# Patient Record
Sex: Male | Born: 1965 | Race: Black or African American | Hispanic: No | Marital: Single | Smoking: Current every day smoker
Health system: Southern US, Community
[De-identification: ages and names within clinical notes are randomized; demographics above are authoritative.]

## PROBLEM LIST (undated history)

## (undated) DIAGNOSIS — F329 Major depressive disorder, single episode, unspecified: Secondary | ICD-10-CM

## (undated) DIAGNOSIS — I10 Essential (primary) hypertension: Secondary | ICD-10-CM

## (undated) DIAGNOSIS — K469 Unspecified abdominal hernia without obstruction or gangrene: Secondary | ICD-10-CM

## (undated) DIAGNOSIS — F419 Anxiety disorder, unspecified: Secondary | ICD-10-CM

## (undated) DIAGNOSIS — F32A Depression, unspecified: Secondary | ICD-10-CM

## (undated) DIAGNOSIS — F319 Bipolar disorder, unspecified: Secondary | ICD-10-CM

## (undated) HISTORY — PX: OTHER SURGICAL HISTORY: SHX169

## (undated) HISTORY — PX: HEMORRHOID SURGERY: SHX153

## (undated) HISTORY — PX: HERNIA REPAIR: SHX51

---

## 2014-06-24 ENCOUNTER — Emergency Department (HOSPITAL_COMMUNITY)
Admission: EM | Admit: 2014-06-24 | Discharge: 2014-06-24 | Disposition: A | Discharge status: Home / Self Care | Payer: Medicaid Other | Attending: Emergency Medicine | Admitting: Emergency Medicine

## 2014-06-24 ENCOUNTER — Encounter (HOSPITAL_COMMUNITY): Payer: Self-pay | Admitting: Emergency Medicine

## 2014-06-24 DIAGNOSIS — H209 Unspecified iridocyclitis: Secondary | ICD-10-CM

## 2014-06-24 DIAGNOSIS — Y9289 Other specified places as the place of occurrence of the external cause: Secondary | ICD-10-CM | POA: Insufficient documentation

## 2014-06-24 DIAGNOSIS — Y9389 Activity, other specified: Secondary | ICD-10-CM | POA: Diagnosis not present

## 2014-06-24 DIAGNOSIS — Z720 Tobacco use: Secondary | ICD-10-CM | POA: Insufficient documentation

## 2014-06-24 DIAGNOSIS — W228XXA Striking against or struck by other objects, initial encounter: Secondary | ICD-10-CM | POA: Diagnosis not present

## 2014-06-24 DIAGNOSIS — S0592XA Unspecified injury of left eye and orbit, initial encounter: Secondary | ICD-10-CM | POA: Diagnosis present

## 2014-06-24 DIAGNOSIS — H11422 Conjunctival edema, left eye: Secondary | ICD-10-CM | POA: Insufficient documentation

## 2014-06-24 DIAGNOSIS — H2000 Unspecified acute and subacute iridocyclitis: Secondary | ICD-10-CM | POA: Diagnosis not present

## 2014-06-24 MED ORDER — PREDNISOLONE ACETATE 1 % OP SUSP
1.0000 [drp] | Freq: Once | OPHTHALMIC | Status: AC
  Administered 2014-06-24: 1 [drp] via OPHTHALMIC
  Filled 2014-06-24: qty 1

## 2014-06-24 MED ORDER — FLUORESCEIN SODIUM 1 MG OP STRP
1.0000 | ORAL_STRIP | Freq: Once | OPHTHALMIC | Status: AC
  Administered 2014-06-24: 1 via OPHTHALMIC
  Filled 2014-06-24: qty 1

## 2014-06-24 MED ORDER — TETRACAINE HCL 0.5 % OP SOLN
1.0000 [drp] | Freq: Once | OPHTHALMIC | Status: AC
  Administered 2014-06-24: 1 [drp] via OPHTHALMIC
  Filled 2014-06-24: qty 2

## 2014-06-24 MED ORDER — OXYCODONE-ACETAMINOPHEN 5-325 MG PO TABS: 1.0000 | ORAL_TABLET | Freq: Four times a day (QID) | ORAL | Status: DC | PRN

## 2014-06-24 MED ORDER — OXYCODONE-ACETAMINOPHEN 5-325 MG PO TABS
1.0000 | ORAL_TABLET | Freq: Once | ORAL | Status: AC
  Administered 2014-06-24: 1 via ORAL
  Filled 2014-06-24: qty 1

## 2014-06-24 MED ORDER — CYCLOPENTOLATE HCL 1 % OP SOLN
1.0000 [drp] | Freq: Once | OPHTHALMIC | Status: AC
  Administered 2014-06-24: 1 [drp] via OPHTHALMIC
  Filled 2014-06-24: qty 2

## 2014-06-24 NOTE — Discharge Instructions (Signed)
Please read and follow all provided instructions.  Your diagnoses today include:  1. Iritis, traumatic    Tests performed today include:  Visual acuity testing to check your vision  Fluorescein dye examination to look for scratches on your eye  Vital signs. See below for your results today.   Medications prescribed:   Pred forte - use one drop in left eye 4 times a day   Cyclogyl - use one drop in left eye 2 times a day   Take any prescribed medications only as directed.  Home care instructions:  Follow any educational materials contained in this packet.  If you wear contact lenses, do not use them until your eye caregiver approves. Follow-up care is necessary to be sure the corneal abrasion is healing if not completely resolved in 2-3 days. See your caregiver or eye specialist as suggested for followup.   Follow-up instructions: Please follow-up with the opthalmologist listed in the next 3 days for further evaluation if your symptoms do not improve.   Return instructions:   Please return to the Emergency Department if you experience worsening symptoms.   Please return immediately if you develop severe pain, pus drainage, new change in vision, or fever.  Please return if you have any other emergent concerns.  Additional Information:  Your vital signs today were: BP 123/69   Pulse 112   Temp(Src) 97.7 F (36.5 C) (Oral)   Resp 20   Ht 5\' 9"  (1.753 m)   Wt 185 lb (83.915 kg)   BMI 27.31 kg/m2   SpO2 97% If your blood pressure (BP) was elevated above 135/85 this visit, please have this repeated by your doctor within one month.
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
## 2014-06-24 NOTE — ED Notes (Signed)
Declined W/C at D/C and was escorted to lobby by RN. 
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
## 2014-06-24 NOTE — ED Provider Notes (Signed)
Medical screening examination/treatment/procedure(s) were performed by non-physician practitioner and as supervising physician I was immediately available for consultation/collaboration.   EKG Interpretation None        Martita Brumm, MD 06/24/14 1623 

## 2014-08-16 ENCOUNTER — Emergency Department (HOSPITAL_COMMUNITY)
Admission: EM | Admit: 2014-08-16 | Discharge: 2014-08-16 | Disposition: A | Discharge status: Home / Self Care | Payer: Self-pay | Attending: Emergency Medicine | Admitting: Emergency Medicine

## 2014-08-16 ENCOUNTER — Encounter (HOSPITAL_COMMUNITY): Payer: Self-pay | Admitting: Emergency Medicine

## 2014-08-16 DIAGNOSIS — K088 Other specified disorders of teeth and supporting structures: Secondary | ICD-10-CM | POA: Insufficient documentation

## 2014-08-16 DIAGNOSIS — Z720 Tobacco use: Secondary | ICD-10-CM | POA: Insufficient documentation

## 2014-08-16 DIAGNOSIS — K0889 Other specified disorders of teeth and supporting structures: Secondary | ICD-10-CM

## 2014-08-16 MED ORDER — PENICILLIN V POTASSIUM 500 MG PO TABS: 500.0000 mg | ORAL_TABLET | Freq: Three times a day (TID) | ORAL | Status: DC

## 2014-08-16 MED ORDER — TRAMADOL HCL 50 MG PO TABS: 50.0000 mg | ORAL_TABLET | Freq: Four times a day (QID) | ORAL | Status: DC | PRN

## 2014-08-16 NOTE — ED Notes (Signed)
Pt refused vitals, signed discharge and was escorted out by [employer]. Pt was given d/c paper worl, scripts and dental referral.
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
## 2014-08-16 NOTE — ED Notes (Signed)
Pt c/o [place] bone being stuck in upper right tooth line for 3 weeks. Pt states he has been tx for same complaint and put on abx but hasnt seen a dentist yet.

## 2014-08-16 NOTE — ED Notes (Signed)
Pt a/o x 4 on d/c, pt upset, see nursing notes.

## 2014-08-16 NOTE — ED Notes (Signed)
Pt ambulated to bathroom independently and with a steady gait, asking to see doctor.

## 2014-08-16 NOTE — Discharge Instructions (Signed)
Dental Pain °A tooth ache may be caused by cavities (tooth decay). Cavities expose the nerve of the tooth to air and hot or cold temperatures. It may come from an infection or abscess (also called a boil or furuncle) around your tooth. It is also often caused by dental caries (tooth decay). This causes the pain you are having. °DIAGNOSIS  °Your caregiver can diagnose this problem by exam. °TREATMENT  °· If caused by an infection, it may be treated with medications which kill germs (antibiotics) and pain medications as prescribed by your caregiver. Take medications as directed. °· Only take over-the-counter or prescription medicines for pain, discomfort, or fever as directed by your caregiver. °· Whether the tooth ache today is caused by infection or dental disease, you should see your dentist as soon as possible for further care. °SEEK MEDICAL CARE IF: °The exam and treatment you received today has been provided on an emergency basis only. This is not a substitute for complete medical or dental care. If your problem worsens or new problems (symptoms) appear, and you are unable to meet with your dentist, call or return to this location. °SEEK IMMEDIATE MEDICAL CARE IF:  °· You have a fever. °· You develop redness and swelling of your face, jaw, or neck. °· You are unable to open your mouth. °· You have severe pain uncontrolled by pain medicine. °MAKE SURE YOU:  °· Understand these instructions. °· Will watch your condition. °· Will get help right away if you are not doing well or get worse. °Document Released: 08/17/2005 Document Revised: 11/09/2011 Document Reviewed: 04/04/2008 °ExitCare® Patient Information ©2015 [employer]. This information is not intended to replace advice given to you by your health care provider. Make sure you discuss any questions you have with your health care provider. ° °Emergency Department Resource Guide °1) Find a Doctor and Pay Out of Pocket °Although you won't have to find out who  is covered by your insurance plan, it is a good idea to ask around and get recommendations. You will then need to call the office and see if the doctor you have chosen will accept you as a new patient and what types of options they offer for patients who are self-pay. Some doctors offer discounts or will set up payment plans for their patients who do not have insurance, but you will need to ask so you aren't surprised when you get to your appointment. ° °2) Contact Your Local Health Department °Not all health departments have doctors that can see patients for sick visits, but many do, so it is worth a call to see if yours does. If you don't know where your local health department is, you can check in your phone book. The CDC also has a tool to help you locate your state's health department, and many state websites also have listings of all of their local health departments. ° °3) Find a Walk-in Clinic °If your illness is not likely to be very severe or complicated, you may want to try a walk in clinic. These are popping up all over the country in pharmacies, drugstores, and shopping centers. They're usually staffed by nurse practitioners or physician assistants that have been trained to treat common illnesses and complaints. They're usually fairly quick and inexpensive. However, if you have serious medical issues or chronic medical problems, these are probably not your best option. ° °No Primary Care Doctor: °- Call Health Connect at  832-8000 - they can help you locate a primary   care doctor that  accepts your insurance, provides certain services, etc. °- Physician Referral Service- 1-800-533-3463 ° °Chronic Pain Problems: °Organization         Address  Phone   Notes  °[facility]  ([phone]) [phone] Patients need to be referred by their primary care doctor.  ° °Medication Assistance: °Organization         Address  Phone   Notes  °[place] [facility] [address]  [address] °[place], [place] [address] (336) 641-8030 --Must be a resident of [place] °-- Must have NO insurance coverage whatsoever (no Medicaid/ Medicare, etc.) °-- The pt. MUST have a primary care doctor that directs their care regularly and follows them in the community °  °[facility]  (866) 331-1348   °[employer]  (888) 892-1162   ° °Agencies that provide inexpensive medical care: °Organization         Address  Phone   Notes  °[facility]  ([phone]) [phone]   °[facility]    (336) 832-7272   °[facility] [address] °[place], [place] [address] (336) 832-4777   °[facility] [address], °[place] (336) 271-4999   °[facility]    (336) 373-0678   °[facility]    (336) 272-1050   °[facility] ° [address], [place] Phone:  (336) 832-4444, Fax:  (336) 832-4440 Hours of Operation:  9 am - 6 pm, M-F.  Also accepts Medicaid/Medicare and self-pay.  °[facility] ° [address], [place] Phone: (336) [phone], Fax: (336) [phone]. Hours of Operation:  8:30 am - 5:30 pm, M-F.  Also accepts Medicaid and self-pay.  °[facility] [place] [address], [place] Phone: (336) 878-6027   °[facility] [address], [place], [place] (336)723-1848, Ext. 123 Mondays & Thursdays: 7-9 AM.  First 15 patients are seen on a first come, first serve basis. °  ° °Medicaid-accepting [place] Providers: ° °Organization         Address  Phone   Notes  °[facility] [address], [place] ([phone]) [phone] Also accepts self-pay patients.  °[facility] [address], [place] ° (336) [phone]   °[facility] [address], [place] (336) [phone]   °[facility] [address] [place] [address], [place] (336) [phone]   °[facility] [address], [place]  ° (336) [phone] Only accepts [place] Access Medicaid patients after they have their name applied to their card.  ° °Self-Pay (no insurance) in [place]: ° °Organization         Address  Phone   Notes  °Sickle Cell Patients, [facility] [address], [place] (336) [phone]   °[facility] [address], [place] (336) 832-4400   ° [facility] ° [address], [place] (336) [phone]   °[facility]/Dr. Osei-Bonsu ° [address] [place] [address], [place] or [address], [place] ([phone]) [phone] Phone number for both [place] and [place] locations is the same.  °[facility] [address], [place] (336) [phone]   °[facility] [address] [place] [address],  or [address] (336) 852-7530 °(336) [phone]   °  [facility] [address], [place] (336) 350-1642, phone; (336) 294-5005, fax Sees patients 1st and 3rd Saturday of every month.  Must not qualify for public or private insurance (i.e. Medicaid, Medicare, [place] Health Choice, Veterans' Benefits) • Household income should be no more than 200% of the poverty level •The clinic cannot treat you if you are pregnant or think you are pregnant • Sexually transmitted diseases are not treated at the clinic.  ° ° °Dental Care: °Organization         Address  Phone  Notes  °[facility] [address], [place] (336) 641-6152 Accepts children up to age 21 who are enrolled in Medicaid or [place] Health Choice; pregnant women with a Medicaid card; and children who have applied for Medicaid or [place] Health Choice, but were declined, whose parents can pay a reduced fee at time of service.  °[facility] [place]  [address], [place] (336) 641-7733 Accepts children up to age 21 who are  enrolled in Medicaid or [place] Health Choice; pregnant women with a Medicaid card; and children who have applied for Medicaid or [place] Health Choice, but were declined, whose parents can pay a reduced fee at time of service.  °[facility] ° [address], [place] (336) 641-4533 Patients are seen by appointment only. Walk-ins are not accepted. [facility] will see patients 18 years of age and older. °Monday - Tuesday (8am-5pm) °Most Wednesdays (8:30-5pm) °$30 per visit, cash only  °[facility] ° [address], [place] (336) [phone] Patients are seen by appointment only. Walk-ins are not accepted. [facility] will see patients 18 years of age and older. °One Wednesday Evening (Monthly: Volunteer Based).  $30 per visit, cash only  °[facility]  ([phone]) 537-3737 for adults; Children under age 4, call Graduate Pediatric Dentistry at ([phone]) [phone]. Children aged 4-14, please call ([phone]) [phone] to request a pediatric application. ° Dental services are provided in all areas of dental care including fillings, crowns and bridges, complete and partial dentures, implants, gum treatment, root canals, and extractions. Preventive care is also provided. Treatment is provided to both adults and children. °Patients are selected via a lottery and there is often a waiting list. °  °[facility] [address], ° ° (336) 763-8833 [email or web address] °  °[facility] [address], [place], [place] (336)723-1848, Ext. 123 Second and Fourth Thursday of each month, opens at 6:30 AM; Clinic ends at 9 AM.  Patients are seen on a first-come first-served basis, and a limited number are seen during each clinic.  ° °[facility] ° [address], [place], [place] (336) [phone]   Eligibility Requirements °You must have lived in [place], [place], or [place] counties for at least the last three months. °  You  cannot be eligible for state or federal sponsored healthcare insurance, including Veterans Administration, Medicaid, or Medicare. °  You generally cannot be eligible for healthcare insurance through your employer.  °  How to apply: °Eligibility screenings are held every Tuesday and Wednesday afternoon from 1:00 pm until 4:00 pm. You do not need an appointment for the interview!  °[facility] [address], [place], [place] [phone]   °[facility]  [phone]   °[facility]  [phone]   °[facility]  [phone]   °  °

## 2014-08-17 NOTE — ED Provider Notes (Signed)
CSN: [id number]     Arrival date & time 08/16/14  [id number] History   First MD Initiated Contact with Patient 08/16/14 0454     Chief Complaint  Patient presents with  . Dental Pain     (Consider location/radiation/quality/duration/timing/severity/associated sxs/prior Treatment) Patient is a 48 y.o. male presenting with tooth pain. The history is provided by the patient. No language interpreter was used.  Dental Pain Location:  Upper Associated symptoms: no facial swelling and no fever   Associated symptoms comment:  The patient complains of a "bone sticking from between my teeth" that is causing pain.  He denies facial swelling or fever.   History reviewed. No pertinent past medical history. History reviewed. No pertinent past surgical history. History reviewed. No pertinent family history. History  Substance Use Topics  . Smoking status: Current Every Day Smoker  . Smokeless tobacco: Not on file  . Alcohol Use: Yes    Review of Systems  Constitutional: Negative for fever and chills.  HENT: Negative for ear pain, facial swelling and trouble swallowing.        Toothache.  Gastrointestinal: Negative.   Musculoskeletal: Negative for myalgias.  Neurological: Negative.       Allergies  Review of patient's allergies indicates no known allergies.  Home Medications   Prior to Admission medications   Medication Sig Start Date End Date Taking? Authorizing Provider  oxyCODONE-acetaminophen (PERCOCET/ROXICET) 5-325 MG per tablet Take 1-2 tablets by mouth every 6 (six) hours as needed for severe pain. 06/24/14   Renne CriglerJoshua Geiple, PA-C  penicillin v potassium (VEETID) 500 MG tablet Take 1 tablet (500 mg total) by mouth 3 (three) times daily. 08/16/14   Yacine Garriga A Donzella Carrol, PA-C  traMADol (ULTRAM) 50 MG tablet Take 1 tablet (50 mg total) by mouth every 6 (six) hours as needed. 08/16/14   Darshan Solanki A Lajoyce Tamura, PA-C   BP 128/81 mmHg  Pulse 103  Temp(Src) 98 F (36.7 C) (Oral)  Resp 17  Ht 5\' 8"   (1.727 m)  Wt 185 lb (83.915 kg)  BMI 28.14 kg/m2  SpO2 96% Physical Exam  Constitutional: He is oriented to person, place, and time. He appears well-developed and well-nourished.  HENT:  Mouth/Throat: Oropharynx is clear and moist.  Upper left peridontal disease. There is trauma apparent to medial and lateral alveolar ridge. No foreign body observed.   Neurological: He is alert and oriented to person, place, and time.  Psychiatric:  The patient is angry, agitated.    ED Course  Procedures (including critical care time) Labs Review Labs Reviewed - No data to display  Imaging Review No results found.   EKG Interpretation None      MDM   Final diagnoses:  Pain, dental     He has a pair of small scissors he is using to point to area between upper 1st and 2nd molar teeth that he jabs into the gum line.  He will not accept reasonable explanation of exam findings and appropriate follow up in outpatient setting. [employer] involved in discharge from the department.    Arnoldo HookerShari A Geovonni Meyerhoff, PA-C 08/18/14 [id number]  Linwood DibblesJon Knapp, MD 08/23/14 1019

## 2014-08-20 ENCOUNTER — Emergency Department (HOSPITAL_COMMUNITY)
Admission: EM | Admit: 2014-08-20 | Discharge: 2014-08-21 | Disposition: A | Discharge status: Other Acute Inpatient Hospital | Payer: Federal, State, Local not specified - Other | Attending: Emergency Medicine | Admitting: Emergency Medicine

## 2014-08-20 ENCOUNTER — Encounter (HOSPITAL_COMMUNITY): Payer: Self-pay

## 2014-08-20 DIAGNOSIS — F1210 Cannabis abuse, uncomplicated: Secondary | ICD-10-CM | POA: Insufficient documentation

## 2014-08-20 DIAGNOSIS — F32A Depression, unspecified: Secondary | ICD-10-CM

## 2014-08-20 DIAGNOSIS — Z720 Tobacco use: Secondary | ICD-10-CM | POA: Insufficient documentation

## 2014-08-20 DIAGNOSIS — Z792 Long term (current) use of antibiotics: Secondary | ICD-10-CM | POA: Insufficient documentation

## 2014-08-20 DIAGNOSIS — R45851 Suicidal ideations: Secondary | ICD-10-CM

## 2014-08-20 DIAGNOSIS — F1410 Cocaine abuse, uncomplicated: Secondary | ICD-10-CM | POA: Insufficient documentation

## 2014-08-20 DIAGNOSIS — F1910 Other psychoactive substance abuse, uncomplicated: Secondary | ICD-10-CM | POA: Diagnosis present

## 2014-08-20 DIAGNOSIS — R45850 Homicidal ideations: Secondary | ICD-10-CM

## 2014-08-20 DIAGNOSIS — F329 Major depressive disorder, single episode, unspecified: Secondary | ICD-10-CM | POA: Insufficient documentation

## 2014-08-20 LAB — COMPREHENSIVE METABOLIC PANEL
ALK PHOS: 40 U/L (ref 39–117)
ALT: 42 U/L (ref 0–53)
AST: 63 U/L — AB (ref 0–37)
Albumin: 3.6 g/dL (ref 3.5–5.2)
Anion gap: 19 — ABNORMAL HIGH (ref 5–15)
BILIRUBIN TOTAL: 0.8 mg/dL (ref 0.3–1.2)
BUN: 9 mg/dL (ref 6–23)
CHLORIDE: 99 meq/L (ref 96–112)
CO2: 20 mEq/L (ref 19–32)
Calcium: 8.6 mg/dL (ref 8.4–10.5)
Creatinine, Ser: 1.03 mg/dL (ref 0.50–1.35)
GFR calc Af Amer: >90 mL/min (ref >90)
GFR calc non Af Amer: 85 mL/min — ABNORMAL LOW (ref >90)
Glucose, Bld: 70 mg/dL (ref 70–99)
Potassium: 3.7 mEq/L (ref 3.7–5.3)
Sodium: 138 mEq/L (ref 137–147)
TOTAL PROTEIN: 6.8 g/dL (ref 6.0–8.3)

## 2014-08-20 LAB — CBC WITH DIFFERENTIAL/PLATELET
BASOS ABS: 0.1 10*3/uL (ref 0.0–0.1)
BASOS PCT: 1 % (ref 0–1)
EOS ABS: 0.1 10*3/uL (ref 0.0–0.7)
Eosinophils Relative: 1 % (ref 0–5)
HCT: 42.7 % (ref 39.0–52.0)
HEMOGLOBIN: 14.6 g/dL (ref 13.0–17.0)
Lymphocytes Relative: 40 % (ref 12–46)
Lymphs Abs: 1.9 10*3/uL (ref 0.7–4.0)
MCH: 30.8 pg (ref 26.0–34.0)
MCHC: 34.2 g/dL (ref 30.0–36.0)
MCV: 90.1 fL (ref 78.0–100.0)
Monocytes Absolute: 0.6 10*3/uL (ref 0.1–1.0)
Monocytes Relative: 13 % — ABNORMAL HIGH (ref 3–12)
NEUTROS ABS: 2.1 10*3/uL (ref 1.7–7.7)
Neutrophils Relative %: 45 % (ref 43–77)
PLATELETS: 221 10*3/uL (ref 150–400)
RBC: 4.74 MIL/uL (ref 4.22–5.81)
RDW: 14 % (ref 11.5–15.5)
WBC: 4.8 10*3/uL (ref 4.0–10.5)

## 2014-08-20 LAB — ACETAMINOPHEN LEVEL

## 2014-08-20 LAB — SALICYLATE LEVEL: Salicylate Lvl: <2 mg/dL — ABNORMAL LOW (ref 2.8–20.0)

## 2014-08-20 LAB — TROPONIN I: Troponin I: <0.3 ng/mL (ref <0.30)

## 2014-08-20 LAB — RAPID URINE DRUG SCREEN, HOSP PERFORMED
AMPHETAMINES: NOT DETECTED
BARBITURATES: NOT DETECTED
Benzodiazepines: NOT DETECTED
Cocaine: POSITIVE — AB
Opiates: NOT DETECTED
Tetrahydrocannabinol: POSITIVE — AB

## 2014-08-20 LAB — ETHANOL: ALCOHOL ETHYL (B): 254 mg/dL — AB (ref 0–11)

## 2014-08-20 MED ORDER — THIAMINE HCL 100 MG/ML IJ SOLN: 100.0000 mg | Freq: Every day | INTRAMUSCULAR | Status: DC

## 2014-08-20 MED ORDER — LORAZEPAM 1 MG PO TABS
0.0000 mg | ORAL_TABLET | Freq: Four times a day (QID) | ORAL | Status: DC
  Administered 2014-08-21: 1 mg via ORAL
  Filled 2014-08-20: qty 1

## 2014-08-20 MED ORDER — LORAZEPAM 1 MG PO TABS: 0.0000 mg | ORAL_TABLET | Freq: Two times a day (BID) | ORAL | Status: DC

## 2014-08-20 MED ORDER — VITAMIN B-1 100 MG PO TABS
100.0000 mg | ORAL_TABLET | Freq: Every day | ORAL | Status: DC
  Administered 2014-08-21: 100 mg via ORAL
  Filled 2014-08-20: qty 1

## 2014-08-20 NOTE — BH Assessment (Signed)
[facility] Assessment Progress Note   Called to schedule tele assessment for the pt.  Attempted to call attending EDP, no answer.  Will attempt following tele assessment.  Casimer LaniusKristen Sher Hellinger, MS, [facility] Licensed Professional Counselor Therapeutic Triage Specialist [facility] Phone: [phone] Fax: [phone]

## 2014-08-20 NOTE — ED Notes (Signed)
Resting quietly with eye closed. Easily arousable. Verbally responsive. Resp even and unlabored. ABC's intact. NAD noted.  

## 2014-08-20 NOTE — ED Notes (Signed)
Pt presents via EMS. EMS was initially called out for a syncopal episode but when they arrived pt reported he did not remember passing out and he didn't want to go to the hospital. After initial assessment by EMS, pt said that he did want to go to the hospital but said he didn't know why he wanted to go. En route pt said he was suicidal and wanted to see someone.

## 2014-08-20 NOTE — ED Notes (Signed)
Patient sleeping. Patient when awakened is not cooperative at this time.

## 2014-08-20 NOTE — ED Notes (Signed)
[facility]. Called and given report.

## 2014-08-20 NOTE — ED Notes (Signed)
Resting quietly with eye closed. Easily arousable. Verbally responsive. Resp even and unlabored. ABC's intact. NAD noted. No behavior problems noted.   

## 2014-08-20 NOTE — ED Notes (Signed)
Patient urinated on the floor, wall and bed.

## 2014-08-20 NOTE — ED Provider Notes (Signed)
CSN: [id number]     Arrival date & time 08/20/14  1613 History   First MD Initiated Contact with Patient 08/20/14 1622     Chief Complaint  Patient presents with  . Suicidal     (Consider location/radiation/quality/duration/timing/severity/associated sxs/prior Treatment) HPI Comments: Patient presents emergency department with chief complaint of multiple complaints. Reportedly, EMS was called out for a syncopal episode, which the patient does not remember. He initially declined going to the hospital.  Patient is now complaining of suicidal ideation and homicidal ideation. He states that "I want to hurt everyone." "Life has given up on me so I have given up on life." "I want to hurt myself." He reports that he has taken cocaine, marijuana, and has had a lot of alcohol. Complains of pain at this time, but denies any other symptoms.  He denies any fevers, chills, chest pain, shortness of breath, diarrhea, constipation.  The history is provided by the patient. No language interpreter was used.    History reviewed. No pertinent past medical history. History reviewed. No pertinent past surgical history. No family history on file. History  Substance Use Topics  . Smoking status: Current Every Day Smoker  . Smokeless tobacco: Not on file  . Alcohol Use: Yes    Review of Systems  Constitutional: Negative for fever and chills.  Respiratory: Negative for shortness of breath.   Cardiovascular: Negative for chest pain.  Gastrointestinal: Negative for nausea, vomiting, diarrhea and constipation.  Genitourinary: Negative for dysuria.  All other systems reviewed and are negative.     Allergies  Review of patient's allergies indicates no known allergies.  Home Medications   Prior to Admission medications   Medication Sig Start Date End Date Taking? Authorizing Provider  oxyCODONE-acetaminophen (PERCOCET/ROXICET) 5-325 MG per tablet Take 1-2 tablets by mouth every 6 (six) hours as needed  for severe pain. 06/24/14   Renne CriglerJoshua Geiple, PA-C  penicillin v potassium (VEETID) 500 MG tablet Take 1 tablet (500 mg total) by mouth 3 (three) times daily. 08/16/14   Shari A Upstill, PA-C  traMADol (ULTRAM) 50 MG tablet Take 1 tablet (50 mg total) by mouth every 6 (six) hours as needed. 08/16/14   Shari A Upstill, PA-C   BP 120/72 mmHg  Pulse 88  Temp(Src) 97.9 F (36.6 C) (Oral)  Resp 16  SpO2 98% Physical Exam  Constitutional: He is oriented to person, place, and time. He appears well-developed and well-nourished.  HENT:  Head: Normocephalic and atraumatic.  Eyes: Conjunctivae and EOM are normal. Pupils are equal, round, and reactive to light. Right eye exhibits no discharge. Left eye exhibits no discharge. No scleral icterus.  Neck: Normal range of motion. Neck supple. No JVD present.  Cardiovascular: Normal rate, regular rhythm and normal heart sounds.  Exam reveals no gallop and no friction rub.   No murmur heard. Pulmonary/Chest: Effort normal and breath sounds normal. No respiratory distress. He has no wheezes. He has no rales. He exhibits no tenderness.  Abdominal: Soft. He exhibits no distension and no mass. There is no tenderness. There is no rebound and no guarding.  Musculoskeletal: Normal range of motion. He exhibits no edema or tenderness.  Neurological: He is alert and oriented to person, place, and time.  Skin: Skin is warm and dry.  Psychiatric: He has a normal mood and affect. His behavior is normal. Judgment and thought content normal.  Nursing note and vitals reviewed.   ED Course  Procedures (including critical care time) Results for orders placed  or performed during the hospital encounter of 08/20/14  CBC with Differential  Result Value Ref Range   WBC 4.8 4.0 - 10.5 K/uL   RBC 4.74 4.22 - 5.81 MIL/uL   Hemoglobin 14.6 13.0 - 17.0 g/dL   HCT 08.642.7 57.839.0 - 46.952.0 %   MCV 90.1 78.0 - 100.0 fL   MCH 30.8 26.0 - 34.0 pg   MCHC 34.2 30.0 - 36.0 g/dL   RDW 62.914.0  52.811.5 - 41.315.5 %   Platelets 221 150 - 400 K/uL   Neutrophils Relative % 45 43 - 77 %   Neutro Abs 2.1 1.7 - 7.7 K/uL   Lymphocytes Relative 40 12 - 46 %   Lymphs Abs 1.9 0.7 - 4.0 K/uL   Monocytes Relative 13 (H) 3 - 12 %   Monocytes Absolute 0.6 0.1 - 1.0 K/uL   Eosinophils Relative 1 0 - 5 %   Eosinophils Absolute 0.1 0.0 - 0.7 K/uL   Basophils Relative 1 0 - 1 %   Basophils Absolute 0.1 0.0 - 0.1 K/uL  Comprehensive metabolic panel  Result Value Ref Range   Sodium 138 137 - 147 mEq/L   Potassium 3.7 3.7 - 5.3 mEq/L   Chloride 99 96 - 112 mEq/L   CO2 20 19 - 32 mEq/L   Glucose, Bld 70 70 - 99 mg/dL   BUN 9 6 - 23 mg/dL   Creatinine, Ser 2.441.03 0.50 - 1.35 mg/dL   Calcium 8.6 8.4 - 01.010.5 mg/dL   Total Protein 6.8 6.0 - 8.3 g/dL   Albumin 3.6 3.5 - 5.2 g/dL   AST 63 (H) 0 - 37 U/L   ALT 42 0 - 53 U/L   Alkaline Phosphatase 40 39 - 117 U/L   Total Bilirubin 0.8 0.3 - 1.2 mg/dL   GFR calc non Af Amer 85 (L) >90 mL/min   GFR calc Af Amer >90 >90 mL/min   Anion gap 19 (H) 5 - 15  Ethanol  Result Value Ref Range   Alcohol, Ethyl (B) 254 (H) 0 - 11 mg/dL  Urine rapid drug screen (hosp performed)  Result Value Ref Range   Opiates NONE DETECTED NONE DETECTED   Cocaine POSITIVE (A) NONE DETECTED   Benzodiazepines NONE DETECTED NONE DETECTED   Amphetamines NONE DETECTED NONE DETECTED   Tetrahydrocannabinol POSITIVE (A) NONE DETECTED   Barbiturates NONE DETECTED NONE DETECTED  Acetaminophen level  Result Value Ref Range   Acetaminophen (Tylenol), Serum <15.0 10 - 30 ug/mL  Salicylate level  Result Value Ref Range   Salicylate Lvl <2.0 (L) 2.8 - 20.0 mg/dL  Troponin I  Result Value Ref Range   Troponin I <0.30 <0.30 ng/mL   No results found.    EKG Interpretation None      MDM   Final diagnoses:  None   Patient with syncopal episode, suicidal and homicidal ideation. Will check labs, consult TTS, normal reassess.  H&H stable, orthostatics stable, no arrhythmias on  EKG.  Medically clear, recommend psych evaluation for SI/HI.  TTS consult pending.    Roxy HorsemanRobert Bryam Taborda, PA-C 08/20/14 2236  Arby BarretteMarcy Pfeiffer, MD 08/20/14 [phone]

## 2014-08-20 NOTE — ED Notes (Signed)
Bed: WA09 Expected date:  Expected time:  Means of arrival:  Comments: ems 

## 2014-08-20 NOTE — ED Notes (Signed)
Patient reports he last drank alcohol 2 hours ago, smoking weed, and using cocaine.

## 2014-08-20 NOTE — ED Notes (Signed)
Patient has three bags of belongings in locker 32.

## 2014-08-20 NOTE — ED Notes (Signed)
Pt reports he wants to "hurt everybody else and his self sometimes".

## 2014-08-20 NOTE — BH Assessment (Signed)
Writer informed TTS Baxter HireKristen of the assessment.

## 2014-08-20 NOTE — ED Notes (Signed)
Awake. Verbally responsive. A/O x4. Resp even and unlabored. No audible adventitious breath sounds noted. ABC's intact. No behavior problems noted. Sitter at bedside. Pt given drink. NAD noted.

## 2014-08-20 NOTE — ED Notes (Signed)
Requested from TTS to have patient use Telepsych. atient drowsy and unable to answer questions at this time.

## 2014-08-20 NOTE — ED Notes (Signed)
Pt is on the monitor but is not cooperating with staff for orthostatics or UA at this time.

## 2014-08-20 NOTE — BH Assessment (Addendum)
[facility] Assessment Progress Note    Called, spoke with pt's nurse, Silvio PateShelia, who stated pt is not alert and cannot be assessed currently.  Called, spoke with EDP Pfieffer regarding pt disposition.  WLED to call back when pt can be assessed by TTS.  Casimer LaniusKristen Mliss Wedin, MS, [facility] Licensed Professional Counselor Therapeutic Triage Specialist [facility] Phone: [phone] Fax: [phone]

## 2014-08-21 ENCOUNTER — Encounter (HOSPITAL_COMMUNITY): Payer: Self-pay | Admitting: Registered Nurse

## 2014-08-21 ENCOUNTER — Encounter (HOSPITAL_COMMUNITY): Payer: Self-pay

## 2014-08-21 ENCOUNTER — Inpatient Hospital Stay (HOSPITAL_COMMUNITY)
Admission: AD | Admit: 2014-08-21 | Discharge: 2014-08-27 | DRG: 897 | Disposition: A | Discharge status: Home / Self Care | Payer: MEDICAID | Source: Intra-hospital | Attending: Psychiatry | Admitting: Psychiatry

## 2014-08-21 DIAGNOSIS — G4700 Insomnia, unspecified: Secondary | ICD-10-CM | POA: Diagnosis present

## 2014-08-21 DIAGNOSIS — R45851 Suicidal ideations: Secondary | ICD-10-CM | POA: Diagnosis present

## 2014-08-21 DIAGNOSIS — F1424 Cocaine dependence with cocaine-induced mood disorder: Secondary | ICD-10-CM | POA: Diagnosis present

## 2014-08-21 DIAGNOSIS — I10 Essential (primary) hypertension: Secondary | ICD-10-CM | POA: Diagnosis present

## 2014-08-21 DIAGNOSIS — Z590 Homelessness: Secondary | ICD-10-CM | POA: Diagnosis not present

## 2014-08-21 DIAGNOSIS — F12288 Cannabis dependence with other cannabis-induced disorder: Secondary | ICD-10-CM | POA: Diagnosis present

## 2014-08-21 DIAGNOSIS — F329 Major depressive disorder, single episode, unspecified: Secondary | ICD-10-CM | POA: Diagnosis present

## 2014-08-21 DIAGNOSIS — F1024 Alcohol dependence with alcohol-induced mood disorder: Secondary | ICD-10-CM | POA: Diagnosis present

## 2014-08-21 DIAGNOSIS — R45850 Homicidal ideations: Secondary | ICD-10-CM

## 2014-08-21 DIAGNOSIS — F32A Depression, unspecified: Secondary | ICD-10-CM | POA: Diagnosis present

## 2014-08-21 DIAGNOSIS — F1994 Other psychoactive substance use, unspecified with psychoactive substance-induced mood disorder: Secondary | ICD-10-CM | POA: Diagnosis present

## 2014-08-21 DIAGNOSIS — F1010 Alcohol abuse, uncomplicated: Secondary | ICD-10-CM

## 2014-08-21 DIAGNOSIS — F1910 Other psychoactive substance abuse, uncomplicated: Secondary | ICD-10-CM | POA: Diagnosis present

## 2014-08-21 HISTORY — DX: Essential (primary) hypertension: I10

## 2014-08-21 MED ORDER — ACETAMINOPHEN 325 MG PO TABS
650.0000 mg | ORAL_TABLET | Freq: Once | ORAL | Status: AC
  Administered 2014-08-21: 650 mg via ORAL
  Filled 2014-08-21: qty 2

## 2014-08-21 MED ORDER — IBUPROFEN 200 MG PO TABS
600.0000 mg | ORAL_TABLET | Freq: Three times a day (TID) | ORAL | Status: DC
  Filled 2014-08-21: qty 3

## 2014-08-21 MED ORDER — THIAMINE HCL 100 MG/ML IJ SOLN: 100.0000 mg | Freq: Every day | INTRAMUSCULAR | Status: DC

## 2014-08-21 MED ORDER — QUETIAPINE FUMARATE 50 MG PO TABS
50.0000 mg | ORAL_TABLET | Freq: Every day | ORAL | Status: DC
  Administered 2014-08-21 – 2014-08-23 (×3): 50 mg via ORAL
  Filled 2014-08-21 (×5): qty 1

## 2014-08-21 MED ORDER — LORAZEPAM 1 MG PO TABS
0.0000 mg | ORAL_TABLET | Freq: Two times a day (BID) | ORAL | Status: AC
  Administered 2014-08-22: 2 mg via ORAL
  Administered 2014-08-23: 1 mg via ORAL
  Filled 2014-08-21: qty 2
  Filled 2014-08-21: qty 1

## 2014-08-21 MED ORDER — VITAMIN B-1 100 MG PO TABS
100.0000 mg | ORAL_TABLET | Freq: Every day | ORAL | Status: DC
  Administered 2014-08-22 – 2014-08-27 (×6): 100 mg via ORAL
  Filled 2014-08-21 (×8): qty 1

## 2014-08-21 MED ORDER — PNEUMOCOCCAL VAC POLYVALENT 25 MCG/0.5ML IJ INJ
0.5000 mL | INJECTION | INTRAMUSCULAR | Status: DC
Start: 2014-08-22 — End: 2014-08-23

## 2014-08-21 MED ORDER — MAGNESIUM HYDROXIDE 400 MG/5ML PO SUSP: 30.0000 mL | Freq: Every day | ORAL | Status: DC | PRN

## 2014-08-21 MED ORDER — ACETAMINOPHEN 325 MG PO TABS
650.0000 mg | ORAL_TABLET | Freq: Four times a day (QID) | ORAL | Status: DC | PRN
Start: 2014-08-21 — End: 2014-08-21

## 2014-08-21 MED ORDER — IBUPROFEN 600 MG PO TABS
600.0000 mg | ORAL_TABLET | Freq: Four times a day (QID) | ORAL | Status: DC | PRN
  Administered 2014-08-21 – 2014-08-27 (×10): 600 mg via ORAL
  Filled 2014-08-21: qty 20
  Filled 2014-08-21 (×10): qty 1

## 2014-08-21 MED ORDER — IBUPROFEN 200 MG PO TABS
600.0000 mg | ORAL_TABLET | Freq: Four times a day (QID) | ORAL | Status: DC | PRN
  Administered 2014-08-21: 600 mg via ORAL

## 2014-08-21 MED ORDER — ALUM & MAG HYDROXIDE-SIMETH 200-200-20 MG/5ML PO SUSP: 30.0000 mL | ORAL | Status: DC | PRN

## 2014-08-21 MED ORDER — LORAZEPAM 1 MG PO TABS
0.0000 mg | ORAL_TABLET | Freq: Four times a day (QID) | ORAL | Status: AC
  Administered 2014-08-21 – 2014-08-22 (×3): 1 mg via ORAL
  Filled 2014-08-21 (×3): qty 1

## 2014-08-21 NOTE — Progress Notes (Signed)
Patient ID: Raylene Miyamotornez Lukens, male   DOB: 11/17/1965, 48 y.o.   MRN: [id number]   48 year old male presents to [facility] with depression, expressed SI and substance abuse. Ppresents with a flat affect and irritable behavior.  Pt brought to the ED by police and [facility]. Pt states "I don't want to get into the details right now" about why he was brought in by police. Pt states he is currently homeless bu stays as an "overnight" [facility]. Pt complains that he has not been sleeping and cannot afford his "mental health" medication, Latuda. Pt states that he is "depressed." Pt reports using 5-6 24 ounce drinks a day and using occasional marijuana and cocaine ("about once a month"). Pt reports tooth pain 10 on a 0-10 scale. Pt states "I think there is something in there." Pt has not seen dentist in years. Pt oriented to unit. Pt given opportunity to express concerns and ask questions. Pt currently denies SI/HI and A/V hallucinations. Pt verbally agrees to seek staff if SI/HI or A/VH occurs and to consult with staff before acting on these thoughts.
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
## 2014-08-21 NOTE — ED Notes (Signed)
Pt transferred from main ed, presents feeling SI, no specific plan, HI no specific plan.  No AV hallucinations, feeling hopeless.  Pt admits to previous history of SI by taking overdose of pills a few years ago.  Pt reports he drinks a lot of alcohol, will not elaborate further, admits to cocaine and marijuana abuse.  Pt reports diagnosed with 5 different diseases including Bipolar DO.  Pt cooperative but irritable at present.  Pt is IVC.

## 2014-08-21 NOTE — ED Notes (Signed)
TTS Ford at bedside evaluating pt at present.

## 2014-08-21 NOTE — Tx Team (Signed)
Initial Interdisciplinary Treatment Plan   PATIENT STRESSORS: Financial difficulties Legal issue Substance abuse   PATIENT STRENGTHS: Capable of independent living Physical Health   PROBLEM LIST: Problem List/Patient Goals Date to be addressed Date deferred Reason deferred Estimated date of resolution  "I'm depressed" 08/21/2014 08/21/2014    "I'd like to stop drinking" 08/21/2014 08/21/2014    Insomnia 08/21/2014 08/21/2014    Find alternative housing 08/21/2014 08/21/2014                                   DISCHARGE CRITERIA:  Ability to meet basic life and health needs Safe-care adequate arrangements made Withdrawal symptoms are absent or subacute and managed without 24-hour nursing intervention  PRELIMINARY DISCHARGE PLAN: Attend aftercare/continuing care group Attend 12-step recovery group Placement in alternative living arrangements  PATIENT/FAMIILY INVOLVEMENT: This treatment plan has been presented to and reviewed with the patient, Vincent Gallegos . The patient and family have been given the opportunity to ask questions and make suggestions.  Aurora Maskwyman, Estephanie Hubbs E 08/21/2014, 7:47 PM

## 2014-08-21 NOTE — BH Assessment (Signed)
[facility] Assessment Progress Note  [facility] Thedore MinsMojeed Akintayo, MD.  Per Berneice Heinrichina Tate, RN, [facility], pt has been assigned to Rm [id number].  Pt signed Consent to Release Information to [facility].  This form along with IVC paperwork has [facility].  Pt's nurse, Carlisle BeersLuann, has been notified.  She agrees to send original paperwork with pt via [employer], and to call report to [phone].  Doylene Canninghomas Felicia Both, MA Triage Specialist 08/21/2014 @ 13:07

## 2014-08-22 DIAGNOSIS — F333 Major depressive disorder, recurrent, severe with psychotic symptoms: Secondary | ICD-10-CM

## 2014-08-22 DIAGNOSIS — F1420 Cocaine dependence, uncomplicated: Secondary | ICD-10-CM

## 2014-08-22 DIAGNOSIS — R45851 Suicidal ideations: Secondary | ICD-10-CM

## 2014-08-22 DIAGNOSIS — F1220 Cannabis dependence, uncomplicated: Secondary | ICD-10-CM

## 2014-08-22 DIAGNOSIS — F1020 Alcohol dependence, uncomplicated: Secondary | ICD-10-CM

## 2014-08-22 MED ORDER — ENSURE COMPLETE PO LIQD
237.0000 mL | Freq: Two times a day (BID) | ORAL | Status: DC
  Administered 2014-08-22 – 2014-08-27 (×6): 237 mL via ORAL

## 2014-08-22 MED ORDER — NICOTINE 21 MG/24HR TD PT24
21.0000 mg | MEDICATED_PATCH | Freq: Every day | TRANSDERMAL | Status: DC
  Administered 2014-08-23 – 2014-08-26 (×4): 21 mg via TRANSDERMAL
  Filled 2014-08-22 (×7): qty 1

## 2014-08-22 NOTE — [facility] Suicide Risk Assessment (Signed)
[facility] INPATIENT:  Family/Significant Other Suicide Prevention Education  Suicide Prevention Education:  Education Completed; Vincente PoliUncle George [phone],  (name of family member/significant other) has been identified by the patient as the family member/significant other with whom the patient will be residing, and identified as the person(s) who will aid the patient in the event of a mental health crisis (suicidal ideations/suicide attempt).  With written consent from the patient, the family member/significant other has been provided the following suicide prevention education, prior to the and/or following the discharge of the patient.  The suicide prevention education provided includes the following:  Suicide risk factors  Suicide prevention and interventions  National Suicide Hotline telephone number  [facility] assessment telephone number  [facility] [place] Emergency Assistance 911  [facility] and/or Residential Mobile Crisis Unit telephone number  Request made of family/significant other to:  Remove weapons (e.g., guns, rifles, knives), all items previously/currently identified as safety concern.    Remove drugs/medications (over-the-counter, prescriptions, illicit drugs), all items previously/currently identified as a safety concern.  The family member/significant other verbalizes understanding of the suicide prevention education information provided.  The family member/significant other agrees to remove the items of safety concern listed above.  Nevin Grizzle, [place] CarboKristin L 08/22/2014, 3:23 PM

## 2014-08-22 NOTE — [facility] Group Notes (Signed)
[facility] LCSW Group Therapy 08/22/2014  1:15 PM   Type of Therapy: Group Therapy  Participation Level: Did Not Attend. Patient invited to attend group but declined.   Samuella BruinKristin Elysha Daw, MSW, [employer]CSWA Clinical Social Worker [facility] [phone]

## 2014-08-22 NOTE — Clinical Social Work Note (Signed)
CSW met with patient at 11:35 am to complete assessment. Patient reports that he did not want to answer CSW's questions and began to get agitated when asked about his history of substance use. CSW will attempt to complete PSA at a later time.  Tilden Fossa, MSW, [place] Worker [facility] [phone]

## 2014-08-22 NOTE — H&P (Addendum)
Psychiatric Admission Assessment Adult  Patient Identification:  Vincent Gallegos  Date of Evaluation:  08/22/2014  Chief Complaint:  ALCOHOL USE DISORDER,SEVERE COCAINE USE DISORDER,MODERATE CANNABIS USE DISORDER,MODERATE BIPOLAR DISORDER NOS  History of Present Illness: Vincent Gallegos is a 48 year old African-American male. He reports, "I went to hospital yesterday because I was feeling like killing myself or somebody else. I have been feeling very depressed for a while. My worsening depression has been going on x a month or so. I go to the [facility] here in [place] for treatment. They put me on Latuda because I have Bipolar. Anette Guarneri is not doing a thing for me. I have been on it x 2 months. I was sober from alcohol & drugs for 6 months. I live at the [facility]. But, my medicine is not helping my problem. I hear voices. So, I relapsed to feel better, became homeless, unemployed and depressed, became SIHI. Decided it is better to go to the hospital that to hurt myself or somebody else".  Elements:  Location:  Alcohol dependence, cocaine dependence, THC abuse, Bipolar affective disorder, . Quality:  Suicidal ideation, homelessness, high anxiety. Severity:  Severe, "I have been feeling suicidal/homicidal ideations". Timing:  current. Duration:  Symptoms worsened a day ago. Context:  "I have been  feeling very depressed,start feeling suicidal/homicidal, felt it is better to go to the hospital instead".  Associated Signs/Synptoms:  Depression Symptoms:  depressed mood, insomnia, suicidal thoughts without plan, anxiety, loss of energy/fatigue,  (Hypo) Manic Symptoms:  Irritable Mood,  Anxiety Symptoms:  Excessive Worry,  Psychotic Symptoms:  Hallucinations: Auditory  PTSD Symptoms: NA  Total Time spent with patient: 1 hour  Psychiatric Specialty Exam: Physical Exam  Constitutional: He is oriented to person, place, and time. He appears well-developed.  HENT:  Head: Normocephalic.  Eyes:  Pupils are equal, round, and reactive to light.  Neck: Normal range of motion.  Cardiovascular:  Elevated blood pressure   Respiratory: Effort normal and breath sounds normal.  GI: Soft. Bowel sounds are normal.  Genitourinary:  Denies any issues  Musculoskeletal: Normal range of motion.  Neurological: He is alert and oriented to person, place, and time.  Skin: Skin is warm and dry.    Review of Systems  Constitutional: Positive for chills, malaise/fatigue and diaphoresis.  HENT: Negative.   Eyes: Negative.   Respiratory: Negative.   Cardiovascular: Negative for chest pain, palpitations, orthopnea, claudication and leg swelling.       Elevated blood pressure  Gastrointestinal: Positive for nausea and abdominal pain.  Genitourinary: Negative.   Musculoskeletal: Positive for myalgias.  Skin: Negative.   Neurological: Positive for dizziness, tremors and weakness.  Endo/Heme/Allergies: Negative.   Psychiatric/Behavioral: Positive for depression, suicidal ideas (Denies any intent or plan), hallucinations and substance abuse (Alcoholism, cocaine & THC dependence). Negative for memory loss. The patient is nervous/anxious and has insomnia.     Blood pressure 126/82, pulse 87, temperature 98.1 F (36.7 C), temperature source Oral, resp. rate 18, height 5' 8"  (1.727 m), weight 76.658 kg (169 lb).Body mass index is 25.7 kg/(m^2).  General Appearance: Disheveled  Eye Contact::  Poor  Speech:  Slurred, garbled  Volume:  Decreased  Mood:  Anxious, Depressed and Hopeless  Affect:  Restricted  Thought Process:  Coherent  Orientation:  Full (Time, Place, and Person)  Thought Content:  Hallucinations: Auditory  Suicidal Thoughts:  Yes.  without intent/plan  Homicidal Thoughts:  No  Memory:  Immediate;   Fair Recent;   Fair Remote;  Poor  Judgement:  Impaired  Insight:  Lacking  Psychomotor Activity:  Tremor  Concentration:  Poor  Recall:  Bird-in[place] of Knowledge:Fair  Language: Fair   Akathisia:  No  Handed:  Right  AIMS (if indicated):     Assets:  Desire for Improvement  Sleep:  Number of Hours: 6.5   Musculoskeletal: Strength & Muscle Tone: within normal limits Gait & Station: normal Patient leans: N/A  Past Psychiatric History: Diagnosis: Bipolar disorder  Hospitalizations: [facility] in 2014  Outpatient Care: [facility] in [place], previously, [place]  Substance Abuse Care: None reported  Self-Mutilation: Denies  Suicidal Attempts: Yes, by overdose 5 years ago  Violent Behaviors: Denies   Past Medical History:   Past Medical History  Diagnosis Date  . Hypertension    Cardiac History:  HTN  Allergies:  No Known Allergies  PTA Medications: Prescriptions prior to admission  Medication Sig Dispense Refill Last Dose  . lurasidone (LATUDA) 40 MG TABS tablet Take 40 mg by mouth daily with breakfast.   More than a month at Unknown time   Previous Psychotropic Medications: Medication/Dose  See medication lists               Substance Abuse History in the last 12 months:  Yes.    Consequences of Substance Abuse: Medical Consequences:  Liver damage, Possible death by overdose Legal Consequences:  Arrests, jail time, Loss of driving privilege. Family Consequences:  Family discord, divorce and or separation.  Social History:  reports that he has been smoking Cigarettes.  He has a 10 pack-year smoking history. He has never used smokeless tobacco. He reports that he drinks alcohol. He reports that he uses illicit drugs (Marijuana and Cocaine) about once per week. Additional Social History: Pain Medications: Pt denies abuse  Prescriptions: Pt denies abuse Over the Counter: Pt denies abuse History of alcohol / drug use?: Yes Longest period of sobriety (when/how long): "A couple of months" Negative Consequences of Use: Financial Withdrawal Symptoms: Aggressive/Assaultive, Irritability, Tremors Name of Substance 1: Alcohol 1 - Age of First  Use: 14  1 - Amount (size/oz): 5-6 24 ounces a day 1 - Frequency: 4-5 days per week 1 - Duration: Ongoing for years 1 - Last Use / Amount: 08/20/14, unknown amount Name of Substance 2: Marijuana 2 - Age of First Use: 14 2 - Amount (size/oz): "not much"  2 - Frequency: "A couple of times per month"  2 - Duration: Ongoing for years 2 - Last Use / Amount: 08/19/14 Name of Substance 3: Cocaine (crack) 3 - Age of First Use: 20 3 - Amount (size/oz): "not very much" 3 - Frequency: 1-2 times per month 3 - Duration: Ongoing for years 3 - Last Use / Amount: Pt doesn't know  Current Place of Residence: [place], [place] ([address])   Place of Birth: [place],    Family Members: None reported  Marital Status:  Single  Children: 2  Sons: 0  Daughters: 2  Relationships: Single  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  Legal History: Has a pending legal charges (Would not elaborate)  Hobbies/Interests: NA  Family History:  History reviewed. No pertinent family history.  Results for orders placed or performed during the hospital encounter of 08/20/14 (from the past 72 hour(s))  CBC with Differential     Status: Abnormal  Collection Time: 08/20/14  5:07 PM  Result Value Ref Range   WBC 4.8 4.0 - 10.5 K/uL   RBC 4.74 4.22 - 5.81 MIL/uL   Hemoglobin 14.6 13.0 - 17.0 g/dL   HCT 42.7 39.0 - 52.0 %   MCV 90.1 78.0 - 100.0 fL   MCH 30.8 26.0 - 34.0 pg   MCHC 34.2 30.0 - 36.0 g/dL   RDW 14.0 11.5 - 15.5 %   Platelets 221 150 - 400 K/uL   Neutrophils Relative % 45 43 - 77 %   Neutro Abs 2.1 1.7 - 7.7 K/uL   Lymphocytes Relative 40 12 - 46 %   Lymphs Abs 1.9 0.7 - 4.0 K/uL   Monocytes Relative 13 (H) 3 - 12 %   Monocytes Absolute 0.6 0.1 - 1.0 K/uL   Eosinophils Relative 1 0 - 5 %    Eosinophils Absolute 0.1 0.0 - 0.7 K/uL   Basophils Relative 1 0 - 1 %   Basophils Absolute 0.1 0.0 - 0.1 K/uL  Comprehensive metabolic panel     Status: Abnormal   Collection Time: 08/20/14  5:07 PM  Result Value Ref Range   Sodium 138 137 - 147 mEq/L   Potassium 3.7 3.7 - 5.3 mEq/L   Chloride 99 96 - 112 mEq/L   CO2 20 19 - 32 mEq/L   Glucose, Bld 70 70 - 99 mg/dL   BUN 9 6 - 23 mg/dL   Creatinine, Ser 1.03 0.50 - 1.35 mg/dL   Calcium 8.6 8.4 - 10.5 mg/dL   Total Protein 6.8 6.0 - 8.3 g/dL   Albumin 3.6 3.5 - 5.2 g/dL   AST 63 (H) 0 - 37 U/L   ALT 42 0 - 53 U/L   Alkaline Phosphatase 40 39 - 117 U/L   Total Bilirubin 0.8 0.3 - 1.2 mg/dL   GFR calc non Af Amer 85 (L) >90 mL/min   GFR calc Af Amer >90 >90 mL/min    Comment: (NOTE) The eGFR has been calculated using the CKD EPI equation. This calculation has not been validated in all clinical situations. eGFR's persistently <90 mL/min signify possible Chronic Kidney Disease.    Anion gap 19 (H) 5 - 15  Ethanol     Status: Abnormal   Collection Time: 08/20/14  5:07 PM  Result Value Ref Range   Alcohol, Ethyl (B) 254 (H) 0 - 11 mg/dL    Comment:        LOWEST DETECTABLE LIMIT FOR SERUM ALCOHOL IS 11 mg/dL FOR MEDICAL PURPOSES ONLY   Acetaminophen level     Status: None   Collection Time: 08/20/14  5:07 PM  Result Value Ref Range   Acetaminophen (Tylenol), Serum <15.0 10 - 30 ug/mL    Comment:        THERAPEUTIC CONCENTRATIONS VARY SIGNIFICANTLY. A RANGE OF 10-30 ug/mL MAY BE AN EFFECTIVE CONCENTRATION FOR MANY PATIENTS. HOWEVER, SOME ARE BEST TREATED AT CONCENTRATIONS OUTSIDE THIS RANGE. ACETAMINOPHEN CONCENTRATIONS >150 ug/mL AT 4 HOURS AFTER INGESTION AND >50 ug/mL AT 12 HOURS AFTER INGESTION ARE OFTEN ASSOCIATED WITH TOXIC REACTIONS.   Salicylate level     Status: Abnormal   Collection Time: 08/20/14  5:07 PM  Result Value Ref Range   Salicylate Lvl <0.8 (L) 2.8 - 20.0 mg/dL  Troponin I     Status: None    Collection Time: 08/20/14  7:03 PM  Result Value Ref Range   Troponin I <0.30 <0.30 ng/mL    Comment:  Due to the release kinetics of cTnI, a negative result within the first hours of the onset of symptoms does not rule out myocardial infarction with certainty. If myocardial infarction is still suspected, repeat the test at appropriate intervals.   Urine rapid drug screen (hosp performed)     Status: Abnormal   Collection Time: 08/20/14  8:50 PM  Result Value Ref Range   Opiates NONE DETECTED NONE DETECTED   Cocaine POSITIVE (A) NONE DETECTED   Benzodiazepines NONE DETECTED NONE DETECTED   Amphetamines NONE DETECTED NONE DETECTED   Tetrahydrocannabinol POSITIVE (A) NONE DETECTED   Barbiturates NONE DETECTED NONE DETECTED    Comment:        DRUG SCREEN FOR MEDICAL PURPOSES ONLY.  IF CONFIRMATION IS NEEDED FOR ANY PURPOSE, NOTIFY LAB WITHIN 5 DAYS.        LOWEST DETECTABLE LIMITS FOR URINE DRUG SCREEN Drug Class       Cutoff (ng/mL) Amphetamine      1000 Barbiturate      200 Benzodiazepine   062 Tricyclics       376 Opiates          300 Cocaine          300 THC              50    Psychological Evaluations:  Assessment:   DSM5: Schizophrenia Disorders:  NA Obsessive-Compulsive Disorders:  NA Trauma-Stressor Disorders:  NA Substance/Addictive Disorders: Alcohol related use disorder, severe, Cannabis dependence, Cocaine dependenc  Depressive Disorders:  Major Depressive Disorder - with Psychotic Features (296.24)  AXIS I:  Alcohol dependence, cocaine dependence, THC dependence,Major Depressive Disorder - with Psychotic Features (296.24)  AXIS II:  Deferred AXIS III:   Past Medical History  Diagnosis Date  . Hypertension    AXIS IV:  other psychosocial or environmental problems and substance dependence issues AXIS V:  11-20 some danger of hurting self or others possible OR occasionally fails to maintain minimal personal hygiene OR gross impairment in  communication  Treatment Plan/Recommendations: 1. Admit for crisis management and stabilization, estimated length of stay 3-5 days.  2. Medication management to reduce current symptoms to base line and improve the patient's overall level of functioning; continue Ativan detox regimen for alcohol deto, Seroquel 50 mg q bedtime for mood control 3. Treat health problems as indicated.   4. Develop treatment plan to decrease risk of relapse upon discharge and the need for readmission.  5. Psycho-social education regarding relapse prevention and self care.  6. Health care follow up as needed for medical problems.  7. Review, reconcile, and reinstate any pertinent home medications for other health issues where appropriate. 8. Call for consults with hospitalist for any additional specialty patient care services as needed.  Treatment Plan Summary: Daily contact with patient to assess and evaluate symptoms and progress in treatment Medication management  Current Medications:  Current Facility-Administered Medications  Medication Dose Route Frequency Provider Last Rate Last Dose  . alum & mag hydroxide-simeth (MAALOX/MYLANTA) 200-200-20 MG/5ML suspension 30 mL  30 mL Oral Q4H PRN Shuvon Rankin, NP      . ibuprofen (ADVIL,MOTRIN) tablet 600 mg  600 mg Oral Q6H PRN Laverle Hobby, PA-C   600 mg at 08/21/14 2234  . LORazepam (ATIVAN) tablet 0-4 mg  0-4 mg Oral 4 times per day Shuvon Rankin, NP   1 mg at 08/22/14 0640   Followed by  . LORazepam (ATIVAN) tablet 0-4 mg  0-4 mg Oral Q12H Shuvon Rankin,  NP      . magnesium hydroxide (MILK OF MAGNESIA) suspension 30 mL  30 mL Oral Daily PRN Shuvon Rankin, NP      . pneumococcal 23 valent vaccine (PNU-IMMUNE) injection 0.5 mL  0.5 mL Intramuscular Tomorrow-1000 Nicholaus Bloom, MD      . QUEtiapine (SEROQUEL) tablet 50 mg  50 mg Oral QHS Laverle Hobby, PA-C   50 mg at 08/21/14 2146  . thiamine (VITAMIN B-1) tablet 100 mg  100 mg Oral Daily Shuvon Rankin, NP    100 mg at 08/22/14 9629   Or  . thiamine (B-1) injection 100 mg  100 mg Intravenous Daily Shuvon Rankin, NP        Observation Level/Precautions:  15 minute checks  Laboratory:  Per ED  Psychotherapy: Group sessions   Medications: See medication lists   Consultations:  As needed  Discharge Concerns:  Safety  Estimated LOS: 2-4 days  Other:     I certify that inpatient services furnished can reasonably be expected to improve the patient's condition.   Lindell Spar I, PMHNP-BC 12/23/201510:37 AM   I have discussed case with NP and have met with patient. Agree with NP note, assessment and note Patient is a 48 year old man, currently struggling with severe stressors, to include homelessness, loss of employment. He states he  Recently relapsed on alcohol and cocaine, and states he has been drinking daily and heavily. ( Admission BAL  254, UDS positive for cocaine and THC) . He reports having been diagnosed with Bipolar Disorder and  Was being managed with Latuda, which he feels is not as effective as he was hoping it would be. He Has been feeling depressed, and reports having had suicidal ruminations and vague homicidal ideations towards " people who disrespect me, who don't give a s--- about me".  At this time depressed, somewhat dysphoric, but denying any current SI or HI, and able to contract for safety on unit.

## 2014-08-22 NOTE — Progress Notes (Signed)
Pt is a new admit to the unit shortly before shift change.  Pt was reporting SI at the ED, but denies at the time of writer's assessment.  Pt says the suicidal thoughts come and go.  He is homeless and unsure of where he will go at discharge.  He denies any withdrawal symptoms at this time.  Pt does complain of dental caries and was medicated with Ibuprofen before he went to bed.  Pt was encouraged to make his needs known to staff.  Pt voices understanding.  Support and encouragement offered.  Safety maintained with q15 minute checks.

## 2014-08-22 NOTE — Clinical Social Work Note (Signed)
CSW informed patient that residential treatment is not an option at this time unless he is able to get his upcoming court appearances continued. Patient reports that he is unable to do that. [facility], states "I need something residential." CSW again informed patient that he is not eligible for residential treatment at this time as he has pending court cases.  CSW provided patient with a listing of [address] as well as contact information for several transitional housing programs and shelters. CSW encouraged patient to contact housing options during his hospitalization to explore possible options. Patient stated "I thought you were supposed to do this." CSW informed patient that he is capable of making contact with housing resources provided, but agreed to [facility] next week as patient requested. Patient verbalized his understanding. CSW will continue to follow.  Samuella BruinKristin Coron Rossano, MSW, [employer] Clinical Social Worker [facility], [facility], [facility] [phone]

## 2014-08-22 NOTE — [facility] Group Notes (Signed)
[facility]  08/22/2014  8:45 AM  Participation Quality: Did Not Attend. Patient in bed, invited to attend but declined.  Samuella BruinKristin Vianne Grieshop, MSW, [employer]CSWA Clinical Social Worker [facility] [phone]

## 2014-08-22 NOTE — Progress Notes (Signed)
D: Patient has depressed affect and mood. He reported on the self inventory sheet that he's sleeping poorly, good appetite and ability to concentrate and normal energy level. Patient rates depression/feelings of hopelessness "7" and anxiety "8". Writer observed that the patient has been lying in bed all day and only rises to take medications and go to meals.  A: Support and encouragement provided to patient. Administered medications per ordering MD. Monitor Q15 minute checks for safety.  R: Patient receptive. Denies SI/HI/AVH. Patient remains safe on the unit.

## 2014-08-22 NOTE — Progress Notes (Signed)
NUTRITION ASSESSMENT  Pt identified as at risk on the Malnutrition Screen Tool  INTERVENTION: 1. Supplements: Ensure Complete po BID, each supplement provides 350 kcal and 13 grams of protein 2. Encourage PO intake  NUTRITION DIAGNOSIS: Unintentional weight loss related to sub-optimal intake as evidenced by per documentation.   Goal: Pt to meet >/= 90% of their estimated nutrition needs.  Monitor:  PO intake  Assessment:  48 y.o. Patient admitted with ETOH abuse, substance abuse, bipolar disorder, anxiety, SI and depression.  Per H&P, pt consumes 5-6 24 oz of alcohol daily.  Per weight history documentation, pt has lost 16 lb since 12/17 (9% weight loss x 6 days, significant for time frame).   Given ETOH and drug abuse, suspect poor quality diet PTA. Pt would benefit from nutritional supplementation. RD to order BID  Height: Ht Readings from Last 1 Encounters:  08/21/14 5\' 8"  (1.727 m)    Weight: Wt Readings from Last 1 Encounters:  08/21/14 169 lb (76.658 kg)    Weight Hx: Wt Readings from Last 10 Encounters:  08/21/14 169 lb (76.658 kg)  08/16/14 185 lb (83.915 kg)  06/24/14 185 lb (83.915 kg)    BMI:  Body mass index is 25.7 kg/(m^2). Pt meets criteria for overweight based on current BMI.  Estimated Nutritional Needs: Kcal: 25-30 kcal/kg Protein: > 1 gram protein/kg Fluid: 1 ml/kcal  Diet Order: Diet regular Pt is also offered choice of unit snacks mid-morning and mid-afternoon.  Pt is eating as desired.   Lab results and medications reviewed.   Tilda FrancoLindsey Eusevio Schriver, MS, RD, LDN Pager: [phone] After Hours Pager: [phone]

## 2014-08-22 NOTE — BHH Suicide Risk Assessment (Addendum)
   Nursing information obtained from:    Demographic factors:   48 year old male, homeless, unemployed  Current Mental Status:   See below Loss Factors:   homelessness, unemployment, limited support network Historical Factors:   Substance Abuse history, states he has been diagnosed with Bipolar Disorder in the past  Risk Reduction Factors:   Resilience  Total Time spent with patient: 45 minutes  CLINICAL FACTORS:  Depression, SI and HI in the context of severe stressors, worsening mood and relapse on cocaine.  Psychiatric Specialty Exam: Physical Exam  ROS  Blood pressure 141/96, pulse 74, temperature 98.1 F (36.7 C), temperature source Oral, resp. rate 18, height 5\' 8"  (1.727 m), weight 76.658 kg (169 lb), SpO2 100 %.Body mass index is 25.7 kg/(m^2).  General Appearance: Fairly Groomed  Patent attorneyye Contact::  Fair  Speech:  Normal Rate  Volume:  Normal  Mood:  Dysphoric  Affect:  Constricted  Thought Process:  Goal Directed and Linear  Orientation:  Full (Time, Place, and Person)  Thought Content:  denies hallucinations, no delusions expressed  Suicidal Thoughts:  Yes.  without intent/plan At this time denies any suicidal plan or intent and contracts for safety on the unit  Homicidal Thoughts:  Yes.  without intent/plan- describes HI towards people " who direspect me", but denies any specific person he wants to hurt at this time and contracts for safety on the unit at present .  Memory: recent and remote grossly intact   Judgement:  Fair  Insight:  Fair  Psychomotor Activity:  Decreased  Concentration:  Good  Recall:  Good  Fund of Knowledge:Good  Language: Good  Akathisia:  Negative  Handed:  Right  AIMS (if indicated):     Assets:  Desire for Improvement Resilience  Sleep:  Number of Hours: 6.5   Musculoskeletal: Strength & Muscle Tone: within normal limits Gait & Station: normal Patient leans: N/A  COGNITIVE FEATURES THAT CONTRIBUTE TO RISK:  Closed-mindedness     SUICIDE RISK:   Moderate:  Frequent suicidal ideation with limited intensity, and duration, some specificity in terms of plans, no associated intent, good self-control, limited dysphoria/symptomatology, some risk factors present, and identifiable protective factors, including available and accessible social support.  PLAN OF CARE: Patient will be admitted to inpatient psychiatric unit for stabilization and safety. Will provide and encourage milieu participation. Provide medication management and maked adjustments as needed.  Will also provide medication management to minimize risk of withdrawal symptoms. Will follow daily.    I certify that inpatient services furnished can reasonably be expected to improve the patient's condition.  COBOS, FERNANDO 08/22/2014, 5:16 PM

## 2014-08-22 NOTE — Clinical Social Work Note (Signed)
As patient requested, CS[facility]- patient declined as he as several upcoming court cases on January 5-6, 2016.   [facility] not [facility] at this time either due to pending court cases.  Samuella BruinKristin Dionta Larke, MSW, [employer] Clinical Social Worker [facility] [phone]

## 2014-08-23 MED ORDER — BENZOCAINE 10 % MT GEL
Freq: Three times a day (TID) | OROMUCOSAL | Status: DC | PRN
  Administered 2014-08-24: 17:00:00 via OROMUCOSAL
  Filled 2014-08-23: qty 9.4

## 2014-08-23 MED ORDER — PNEUMOCOCCAL VAC POLYVALENT 25 MCG/0.5ML IJ INJ
0.5000 mL | INJECTION | INTRAMUSCULAR | Status: AC
  Administered 2014-08-24: 0.5 mL via INTRAMUSCULAR

## 2014-08-23 NOTE — BHH Group Notes (Signed)
B[facility] Group Notes:  (Nursing/MHT/Case Management/Adjunct)  Date:  08/23/2014  Time:  12:08 PM  Type of Therapy:  Psychoeducational Skills  Participation Level:  Did Not Attend  Participation Quality:  Did Not Attend  Affect:  Did Not Attend  Cognitive:  Did Not Attend  Insight:  None  Engagement in Group:  Did Not Attend  Modes of Intervention:  Did Not Attend  Summary of Progress/Problems: Pt did not attend leisure/lifestyle changes group.  Jacquelyne BalintForrest, Corrie Reder Shanta 08/23/2014, 12:08 PM

## 2014-08-23 NOTE — [facility] Group Notes (Signed)
[facility]  08/23/2014  8:45 AM  Participation Quality: Did Not Attend. Patient invited to attend but declined.  Samuella BruinKristin Shereka Lafortune, MSW, [employer] Clinical Social Worker [facility] [phone]

## 2014-08-23 NOTE — Progress Notes (Signed)
D: Pt presents with flat affect and depressed mood. Pt rates depression 7/10. Hopeless 7/10. Anxiety 7/10. Pt depressed and hopeless due to his living situation, pt is currently homeless with no support from his family. Pt is hoping to gain support from his family. Pt has been isolative this morning and noon time. Pt has been in his room and is noncompliant with attending groups. Pt denies any suicidal thoughts or homicidal thoughts today. A: Medications reviewed by Clinical research associatewriter. Verbal support given. Pt encouraged to attend groups. 15 minute checks performed for safety.  R: Pt has minimal interaction and minimal engagement with his peers. Pt continues to c/o toothache.

## 2014-08-23 NOTE — Progress Notes (Signed)
[facility] MD Progress Note  08/23/2014 12:22 PM Vincent Gallegos  MRN:  [id number] Subjective:   Patient states "My tooth is still hurting. That was making me angry and I ended up in here. It's been real hard since I got out of prison two months ago. I have no support and nowhere to stay. I am hoping to reconnect with my family. I really want to change my life. But I often just have the urge to give up and go back to my old ways."   Objective:  Patient is seen and chart is reviewed. The patient continues to report depression and suicidal thoughts. He feels very hopeless with his current life situation. The patient is hopeful that he can go to [place] after discharge to reconnect with family. Vincent Gallegos is compliant with medications and denies adverse side effects. He is still experiencing some symptoms of withdrawal including sweats and tremors. Patient contracts for safety on the unit.   Diagnosis:   DSM5:  Total Time spent with patient: 30 minutes Schizophrenia Disorders: NA Obsessive-Compulsive Disorders: NA Trauma-Stressor Disorders: NA Substance/Addictive Disorders: Alcohol related use disorder, severe, Cannabis dependence, Cocaine dependenc  Depressive Disorders: Major Depressive Disorder - with Psychotic Features (296.24)  AXIS I: Alcohol dependence, cocaine dependence, THC dependence,Major Depressive Disorder - with Psychotic Features (296.24)  AXIS II: Deferred AXIS III:  Past Medical History  Diagnosis Date  . Hypertension    AXIS IV: other psychosocial or environmental problems and substance dependence issues AXIS V: 11-20 some danger of hurting self or others possible OR occasionally fails to maintain minimal personal hygiene OR gross impairment in communication  ADL's:  Intact  Sleep: Fair  Appetite:  Fair  Suicidal Ideation:  Passive SI with no plan Homicidal Ideation:  Denies currently  AEB (as evidenced by):  Psychiatric Specialty Exam: Physical Exam   Review of Systems  Constitutional: Positive for diaphoresis. Negative for fever, chills, weight loss and malaise/fatigue.  HENT: Negative for congestion, ear discharge, ear pain, hearing loss, nosebleeds, sore throat and tinnitus.        Patient complains of tooth pain.   Eyes: Negative for blurred vision, double vision, photophobia, pain, discharge and redness.  Respiratory: Negative for cough, hemoptysis, sputum production, shortness of breath, wheezing and stridor.   Cardiovascular: Negative for chest pain, palpitations, orthopnea, claudication, leg swelling and PND.  Gastrointestinal: Negative for heartburn, nausea, vomiting, abdominal pain, diarrhea, constipation, blood in stool and melena.  Genitourinary: Negative for dysuria, urgency, frequency, hematuria and flank pain.  Musculoskeletal: Negative for myalgias, back pain, joint pain, falls and neck pain.  Skin: Negative for itching and rash.  Neurological: Positive for tremors. Negative for dizziness, tingling, sensory change, speech change, focal weakness, seizures, loss of consciousness, weakness and headaches.  Endo/Heme/Allergies: Negative for environmental allergies and polydipsia. Does not bruise/bleed easily.  Psychiatric/Behavioral: Positive for depression, suicidal ideas and substance abuse. The patient is nervous/anxious.     Blood pressure 145/88, pulse 74, temperature 97.7 F (36.5 C), temperature source Oral, resp. rate 16, height 5\' 8"  (1.727 m), weight 76.658 kg (169 lb), SpO2 100 %.Body mass index is 25.7 kg/(m^2).  General Appearance: Disheveled  Eye Solicitor::  Fair  Speech:  Clear and Coherent  Volume:  Decreased  Mood:  Depressed and Hopeless  Affect:  Full Range  Thought Process:  Coherent  Orientation:  Full (Time, Place, and Person)  Thought Content:  Hallucinations: Auditory  Suicidal Thoughts:  Yes.  without intent/plan  Homicidal Thoughts:  No  Memory:  Immediate;   Good Recent;   Fair Remote;   Fair   Judgement:  Impaired  Insight:  Fair  Psychomotor Activity:  Tremor  Concentration:  Fair  Recall:  Fis[employer]Fair  Fund of Knowledge:Good  Language: Good  Akathisia:  No  Handed:  Right  AIMS (if indicated):     Assets:  Communication Skills Desire for Improvement Physical Health Resilience  Sleep:  Number of Hours: 6   Musculoskeletal: Strength & Muscle Tone: within normal limits Gait & Station: normal Patient leans: N/A  Current Medications: Current Facility-Administered Medications  Medication Dose Route Frequency Provider Last Rate Last Dose  . alum & mag hydroxide-simeth (MAALOX/MYLANTA) 200-200-20 MG/5ML suspension 30 mL  30 mL Oral Q4H PRN Shuvon Rankin, NP      . feeding supplement (ENSURE COMPLETE) (ENSURE COMPLETE) liquid 237 mL  237 mL Oral BID BM Tilda FrancoLindsey Baker, RD   237 mL at 08/22/14 1423  . ibuprofen (ADVIL,MOTRIN) tablet 600 mg  600 mg Oral Q6H PRN Kerry HoughSpencer E Simon, PA-C   600 mg at 08/22/14 1829  . LORazepam (ATIVAN) tablet 0-4 mg  0-4 mg Oral Q12H Shuvon Rankin, NP   1 mg at 08/23/14 0856  . magnesium hydroxide (MILK OF MAGNESIA) suspension 30 mL  30 mL Oral Daily PRN Shuvon Rankin, NP      . nicotine (NICODERM CQ - dosed in mg/24 hours) patch 21 mg  21 mg Transdermal Daily Kerry HoughSpencer E Simon, PA-C   21 mg at 08/23/14 0856  . pneumococcal 23 valent vaccine (PNU-IMMUNE) injection 0.5 mL  0.5 mL Intramuscular Tomorrow-1000 Rachael FeeIrving A Lugo, MD   0.5 mL at 08/22/14 1000  . QUEtiapine (SEROQUEL) tablet 50 mg  50 mg Oral QHS Kerry HoughSpencer E Simon, PA-C   50 mg at 08/22/14 2139  . thiamine (VITAMIN B-1) tablet 100 mg  100 mg Oral Daily Shuvon Rankin, NP   100 mg at 08/23/14 16100856   Or  . thiamine (B-1) injection 100 mg  100 mg Intravenous Daily Shuvon Rankin, NP        Lab Results: No results found for this or any previous visit (from the past 48 hour(s)).  Physical Findings: AIMS: Facial and Oral Movements Muscles of Facial Expression: None, normal Lips and Perioral Area: None,  normal Jaw: None, normal Tongue: None, normal,Extremity Movements Upper (arms, wrists, hands, fingers): None, normal Lower (legs, knees, ankles, toes): None, normal, Trunk Movements Neck, shoulders, hips: None, normal, Overall Severity Severity of abnormal movements (highest score from questions above): None, normal Incapacitation due to abnormal movements: None, normal Patient's awareness of abnormal movements (rate only patient's report): No Awareness, Dental Status Current problems with teeth and/or dentures?: Yes Does patient usually wear dentures?: No  CIWA:  CIWA-Ar Total: 2 COWS:  COWS Total Score: 4  Treatment Plan Summary: Daily contact with patient to assess and evaluate symptoms and progress in treatment Medication management  Plan: 1. Continue crisis management and stabilization.  2. Medication management:  -Continue Ativan taper for alcohol detox -Continue Seroquel 50 mg hs for insomnia/mood control 3. Encouraged patient to attend groups and participate in group counseling sessions and activities.  4. Discharge plan in progress.  5. Continue current treatment plan.  6. Address health issues: Continue motrin 600 mg prn dental pain. Add Orajel as needed.   Medical Decision Making Problem Points:  Established problem, stable/improving (1), Review of last therapy session (1) and Review of psycho-social stressors (1) Data Points:  Review of medication regiment & side effects (2)  Review of new medications or change in dosage (2)  I certify that inpatient services furnished can reasonably be expected to improve the patient's condition.   Israella Hubert NP-C 08/23/2014, 12:22 PM
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
## 2014-08-23 NOTE — Progress Notes (Signed)
Pt has been in the dayroom lying on one of the couches dozing.  He got up at one point saying he was feeling very agitated because he had been asking for two days for a smoking patch and had not received one. This writer had pt last evening and he did not mention needing a nicotine patch.  Pt was given a 21 mg Nicotine patch and his scheduled Ativan per detox protocol at the same time.  Writer also told pt at that time that he would receive his scheduled Seroquel prior to going to bed.  Pt voiced understanding.  Pt voiced no other needs or concerns and was observed by staff to calm down without any other incidents.  Pt says that the sweating has decreased from last night.  Pt continues to say that he has suicidal thoughts off/on, but he can contract for safety.  He is unsure of his discharge plans at this time.  Pt makes his needs known to staff.  Support and encouragement offered.  Safety maintained with q15 minute checks.

## 2014-08-24 DIAGNOSIS — F333 Major depressive disorder, recurrent, severe with psychotic symptoms: Secondary | ICD-10-CM | POA: Insufficient documentation

## 2014-08-24 MED ORDER — QUETIAPINE FUMARATE 100 MG PO TABS
100.0000 mg | ORAL_TABLET | Freq: Every day | ORAL | Status: DC
  Administered 2014-08-24: 100 mg via ORAL
  Filled 2014-08-24 (×3): qty 1

## 2014-08-24 NOTE — Progress Notes (Signed)
Pt reports he is doing better this evening after having a difficult morning.  Pt says he has attended some groups this afternoon.  He attended evening karaoke tonight, but did not participate.  Pt has voiced no needs or concerns tonight.  He says he is having mild withdrawal symptoms, but nothing significant.  He says he is still having some passive suicidal thoughts.  He denies HI/AV at this time.  He is homeless and does not have any definite discharge plans, but is thinking about moving closer to his grown daughters so that he will be close to some of his family.  Pt makes his needs known and can sometimes be rather demanding.  Support and encouragement offered.  Safety maintained with q15 minute checks.

## 2014-08-24 NOTE — Progress Notes (Signed)
Patient ID: Vincent Gallegos, male   DOB: [birth year], 48 y.o.   MRN: [id number] [facility]  08/24/2014 11:25 AM Vincent Gallegos  MRN:  [id number] Subjective:   Patient states "I am just not sleeping at night. Just tossing and turning. That is really making me stressed and irritable. Then when I get to sleep somebody wakes me up. I think I need some kind of medication adjustment. That just makes me depressed and suicidal."    Objective:  Patient is seen and chart is reviewed. The patient continues to report depression and suicidal thoughts. He feels very hopeless with his current life situation. The patient is hopeful that he can go to [place] after discharge to reconnect with family. Vincent Gallegos is compliant with medications and denies adverse side effects. He has completed the Ativan taper and his withdrawal symptoms have decreased. Patient appears irritable at times during the assessment. The patient is observed sleeping during group time. Vincent Gallegos continues to report problems sleeping at night, which he partly attributes to his tooth pain. Reports that his auditory hallucinations have nearly resolved.   Diagnosis:   DSM5:  Total Time spent with patient: 20 minutes Schizophrenia Disorders: NA Obsessive-Compulsive Disorders: NA Trauma-Stressor Disorders: NA Substance/Addictive Disorders: Alcohol related use disorder, severe, Cannabis dependence, Cocaine dependenc  Depressive Disorders: Major Depressive Disorder - with Psychotic Features (296.24)  AXIS I: Alcohol dependence, cocaine dependence, THC dependence,Major Depressive Disorder - with Psychotic Features (296.24)  AXIS II: Deferred AXIS III:  Past Medical History  Diagnosis Date  . Hypertension    AXIS IV: other psychosocial or environmental problems and substance dependence issues AXIS V: 41-50  ADL's:  Intact  Sleep: Fair  Appetite:  Fair  Suicidal Ideation:  Passive SI with no plan Homicidal Ideation:   Denies currently  AEB (as evidenced by):  Psychiatric Specialty Exam: Physical Exam  Review of Systems  Constitutional: Negative for fever, chills, weight loss, malaise/fatigue and diaphoresis.  HENT: Negative for congestion, ear discharge, ear pain, hearing loss, nosebleeds, sore throat and tinnitus.        Patient complains of tooth pain.   Eyes: Negative for blurred vision, double vision, photophobia, pain, discharge and redness.  Respiratory: Negative for cough, hemoptysis, sputum production, shortness of breath, wheezing and stridor.   Cardiovascular: Negative for chest pain, palpitations, orthopnea, claudication, leg swelling and PND.  Gastrointestinal: Negative for heartburn, nausea, vomiting, abdominal pain, diarrhea, constipation, blood in stool and melena.  Genitourinary: Negative for dysuria, urgency, frequency, hematuria and flank pain.  Musculoskeletal: Negative for myalgias, back pain, joint pain, falls and neck pain.  Skin: Negative for itching and rash.  Neurological: Positive for tremors (Improving). Negative for dizziness, tingling, sensory change, speech change, focal weakness, seizures, loss of consciousness, weakness and headaches.  Endo/Heme/Allergies: Negative for environmental allergies and polydipsia. Does not bruise/bleed easily.  Psychiatric/Behavioral: Positive for depression, suicidal ideas and substance abuse. Negative for hallucinations and memory loss. The patient is nervous/anxious and has insomnia.     Blood pressure 137/90, pulse 86, temperature 98.5 F (36.9 C), temperature source Oral, resp. rate 18, height 5\' 8"  (1.727 m), weight 76.658 kg (169 lb), SpO2 100 %.Body mass index is 25.7 kg/(m^2).  General Appearance: Disheveled  Eye SolicitorContact::  Fair  Speech:  Clear and Coherent  Volume:  Decreased  Mood:  Depressed and Irritable  Affect:  Constricted   Thought Process:  Coherent  Orientation:  Full (Time, Place, and Person)  Thought Content:   Rumination  Suicidal Thoughts:  Yes.  without intent/plan  Homicidal Thoughts:  No  Memory:  Immediate;   Good Recent;   Fair Remote;   Fair  Judgement:  Impaired  Insight:  Fair  Psychomotor Activity:  Tremor  Concentration:  Fair  Recall:  [employer] of Knowledge:Good  Language: Good  Akathisia:  No  Handed:  Right  AIMS (if indicated):     Assets:  Communication Skills Desire for Improvement Physical Health Resilience  Sleep:  Number of Hours: 6   Musculoskeletal: Strength & Muscle Tone: within normal limits Gait & Station: normal Patient leans: N/A  Current Medications: Current Facility-Administered Medications  Medication Dose Route Frequency Provider Last Rate Last Dose  . alum & mag hydroxide-simeth (MAALOX/MYLANTA) 200-200-20 MG/5ML suspension 30 mL  30 mL Oral Q4H PRN Shuvon Rankin, NP      . benzocaine (ORAJEL) 10 % mucosal gel   Mouth/Throat TID PRN Fransisca Kaufmann, NP      . feeding supplement (ENSURE COMPLETE) (ENSURE COMPLETE) liquid 237 mL  237 mL Oral BID BM Tilda Franco, RD   237 mL at 08/22/14 1423  . ibuprofen (ADVIL,MOTRIN) tablet 600 mg  600 mg Oral Q6H PRN Kerry Hough, PA-C   600 mg at 08/22/14 1829  . LORazepam (ATIVAN) tablet 0-4 mg  0-4 mg Oral Q12H Shuvon Rankin, NP   1 mg at 08/23/14 0856  . magnesium hydroxide (MILK OF MAGNESIA) suspension 30 mL  30 mL Oral Daily PRN Shuvon Rankin, NP      . nicotine (NICODERM CQ - dosed in mg/24 hours) patch 21 mg  21 mg Transdermal Daily Kerry Hough, PA-C   21 mg at 08/24/14 1026  . QUEtiapine (SEROQUEL) tablet 100 mg  100 mg Oral QHS Fransisca Kaufmann, NP      . thiamine (VITAMIN B-1) tablet 100 mg  100 mg Oral Daily Shuvon Rankin, NP   100 mg at 08/24/14 1026   Or  . thiamine (B-1) injection 100 mg  100 mg Intravenous Daily Shuvon Rankin, NP        Lab Results: No results found for this or any previous visit (from the past 48 hour(s)).  Physical Findings: AIMS: Facial and Oral Movements Muscles of Facial  Expression: None, normal Lips and Perioral Area: None, normal Jaw: None, normal Tongue: None, normal,Extremity Movements Upper (arms, wrists, hands, fingers): None, normal Lower (legs, knees, ankles, toes): None, normal, Trunk Movements Neck, shoulders, hips: None, normal, Overall Severity Severity of abnormal movements (highest score from questions above): None, normal Incapacitation due to abnormal movements: None, normal Patient's awareness of abnormal movements (rate only patient's report): No Awareness, Dental Status Current problems with teeth and/or dentures?: Yes Does patient usually wear dentures?: No  CIWA:  CIWA-Ar Total: 2 COWS:  COWS Total Score: 4  Treatment Plan Summary: Daily contact with patient to assess and evaluate symptoms and progress in treatment Medication management  Plan: 1. Continue crisis management and stabilization.  2. Medication management:  -Increase Seroquel to 100 mg hs for insomnia/mood control 3. Encouraged patient to attend groups and participate in group counseling sessions and activities.  4. Discharge plan in progress.  5. Continue current treatment plan.  6. Address health issues: Continue motrin 600 mg prn dental pain. Add Orajel as needed.   Medical Decision Making Problem Points:  Established problem, stable/improving (1), Review of last therapy session (1) and Review of psycho-social stressors (1) Data Points:  Review of medication regiment & side effects (2) Review of new medications  or change in dosage (2)  I certify that inpatient services furnished can reasonably be expected to improve the patient's condition.   Taytum Wheller NP-C 08/24/2014, 11:25 AM

## 2014-08-24 NOTE — Progress Notes (Signed)
D-Patient has been out on unit and interacting with peers appropriately.  He declined a patient survey. Reports depression with intermittent SI without a plan.  Compliant with medications and no prn's requested. A- Support and positive encouragement offered. Continue current POC and evaluation of treatment goals.  Continue 15' checks for safety.  R- Safety maintained.

## 2014-08-25 MED ORDER — QUETIAPINE FUMARATE 200 MG PO TABS
200.0000 mg | ORAL_TABLET | Freq: Every day | ORAL | Status: DC
  Administered 2014-08-25 – 2014-08-26 (×2): 200 mg via ORAL
  Filled 2014-08-25 (×5): qty 1

## 2014-08-25 MED ORDER — PENICILLIN V POTASSIUM 500 MG PO TABS
500.0000 mg | ORAL_TABLET | Freq: Three times a day (TID) | ORAL | Status: DC
  Administered 2014-08-25 – 2014-08-27 (×7): 500 mg via ORAL
  Filled 2014-08-25 (×3): qty 1
  Filled 2014-08-25 (×2): qty 2
  Filled 2014-08-25 (×2): qty 1
  Filled 2014-08-25: qty 2
  Filled 2014-08-25 (×7): qty 1

## 2014-08-25 MED ORDER — AMLODIPINE BESYLATE 5 MG PO TABS
5.0000 mg | ORAL_TABLET | Freq: Every day | ORAL | Status: DC
  Administered 2014-08-25 – 2014-08-27 (×3): 5 mg via ORAL
  Filled 2014-08-25 (×6): qty 1

## 2014-08-25 NOTE — B[facility] Group Notes (Signed)
[facility] Group Notes:  (Nursing/MHT/Case Management/Adjunct)  Date:  08/25/2014  Time:  3:42 PM  Type of Therapy:  Nurse Education  Participation Level:  Active  Participation Quality:  Appropriate and Attentive  Affect:  Anxious and Depressed  Cognitive:  Alert and Appropriate  Insight:  Appropriate  Engagement in Group:  Engaged  Modes of Intervention:  Discussion and Education  Summary of Progress/Problems:Patient attended Healthy Coping Skills Group.  Twana FirstSmith, Bryannah Boston K 08/25/2014, 3:42 PM

## 2014-08-25 NOTE — Progress Notes (Signed)
D   Pt expressed concern for his discharge and follow up plans and is concerned about getting to [place]   He has been a little irritable and interacts minimally with others  A   Verbal support given   Medications administered and effectiveness monitored   Q 15 min checks R   Pt safe at present

## 2014-08-25 NOTE — [facility] Group Notes (Signed)
[facility] LCSW Group Therapy  08/25/2014 1:53 PM Type of Therapy and Topic:  Group Therapy: Avoiding Self-Sabotaging and Enabling Behaviors  Participation Level:   Did not attend, invited.  Mood:  NA Description of Group:     Learn how to identify obstacles, self-sabotaging and enabling behaviors, what are they, why do we do them and what needs do these behaviors meet? Discuss unhealthy relationships and how to have positive healthy boundaries with those that sabotage and enable. Explore aspects of self-sabotage and enabling in yourself and how to limit these self-destructive behaviors in everyday life. A scaling question is used to help patient look at where they are now in their motivation to change, from 1 to 10 (lowest to highest motivation).  Therapeutic Goals: 1. Patient will identify one obstacle that relates to self-sabotage and enabling behaviors 2. Patient will identify one personal self-sabotaging or enabling behavior they did prior to admission 3. Patient able to establish a plan to change the above identified behavior they did prior to admission:   4. Patient will demonstrate ability to communicate their needs through discussion and/or role plays.   Summary of Patient Progress: Did not attend,unable to assess.  Therapeutic Modalities:    Cognitive Behavioral Therapy Person-Centered Therapy Motivational Interviewing  Santa GeneraAnne Christabella Alvira, [place] Clinical Social Worker     Santa GeneraAnne Rozelle Caudle, [place] Clinical Social Worker  Sallee LangeCunningham, Attikus Bartoszek C 08/25/2014, 1:53 PM

## 2014-08-25 NOTE — Progress Notes (Signed)
D: Patient affect anxious and sad. Patient mood depressed, anxious, and sad. Patient has complained of left upper tooth pain, rating pain as 10 on scale of 0 to 10. Patient c/o intermittent diaphoresis and diaphoresis noted at times by RN. Swelling noted at left cheek. Patient requesting to speak with social worker regarding discharge plans as he is requesting to be [place].  A: patient encouraged to attend and actively participate in groups. Support provided through active listening. Medications administered per order. Fransisca KaufmannLaura Davis, NP advised of patient's c/o pain, facial swelling, and diaphoresis. Armanda HeritageAdvised Ann, LCSW, of patient's request to speak with a social worker regarding his discharge plans. Patient education provided regarding newly prescribed medications.  R: Patient declined participation in morning groups. He did participate in afternoon groups. Patient verbalized understanding of medication teaching. Safety maintained via checks every 15 minutes and patient verbally contracts for safety.

## 2014-08-25 NOTE — Progress Notes (Signed)
Patient ID: Vincent Gallegos Seat, male   DOB: [birth year], 48 y.o.   MRN: [id number] [facility]  08/25/2014 2:18 PM Vincent Gallegos Sparlin  MRN:  [id number] Subjective:   Patient states "I am still not sleeping that well. I was on a higher dose of the Seroquel in prison and that was more helpful. I'm just consumed with worry about getting to [place] and being able to get to a Dentist."   Objective:  Patient is seen and chart is reviewed. The patient continues to report depression and suicidal thoughts. He feels very hopeless with his current life situation. The patient is hopeful that he can go to [place] after [place] to reconnect with family. [name] is compliant with medications and denies adverse side effects.  Patient contracts for safety on the unit. [facility] but never got to take all of the prescription. Records from epic show that he was prescribed that medication in the ED for dental infection. Patient affirms that he has not medication allergies.   Diagnosis:   DSM5:  Total Time spent with patient: 20 minutes Schizophrenia Disorders: NA Obsessive-Compulsive Disorders: NA Trauma-Stressor Disorders: NA Substance/Addictive Disorders: Alcohol related use disorder, severe, Cannabis dependence, Cocaine dependence Depressive Disorders: Major Depressive Disorder - with Psychotic Features (296.24)  AXIS I: Alcohol dependence, cocaine dependence, THC dependence,Major Depressive Disorder - with Psychotic Features (296.24)  AXIS II: Deferred AXIS III:  Past Medical History  Diagnosis Date  . Hypertension    AXIS IV: other psychosocial or environmental problems and substance dependence issues AXIS V: 31-40  ADL's:  Intact  Sleep: Fair  Appetite:  Fair  Suicidal Ideation:  Passive SI with no plan Homicidal Ideation:  Denies currently  AEB (as evidenced by):  Psychiatric Specialty Exam: Physical Exam   Review of Systems  Constitutional: Negative for fever, chills, weight loss, malaise/fatigue and diaphoresis.  HENT: Negative for congestion, ear discharge, ear pain, hearing loss, nosebleeds, sore throat and tinnitus.        Patient complains of tooth pain.   Eyes: Negative for blurred vision, double vision, photophobia, pain, discharge and redness.  Respiratory: Negative for cough, hemoptysis, sputum production, shortness of breath, wheezing and stridor.   Cardiovascular: Negative for chest pain, palpitations, orthopnea, claudication, leg swelling and PND.  Gastrointestinal: Negative for heartburn, nausea, vomiting, abdominal pain, diarrhea, constipation, blood in stool and melena.  Genitourinary: Negative for dysuria, urgency, frequency, hematuria and flank pain.  Musculoskeletal: Negative for myalgias, back pain, joint pain, falls and neck pain.  Skin: Negative for itching and rash.  Neurological: Positive for tremors. Negative for dizziness, tingling, sensory change, speech change, focal weakness, seizures, loss of consciousness, weakness and headaches.  Endo/Heme/Allergies: Negative for environmental allergies and polydipsia. Does not bruise/bleed easily.  Psychiatric/Behavioral: Positive for depression, suicidal ideas and substance abuse (UDS positive for cocaine and marijuana ). The patient is nervous/anxious.     Blood pressure 131/88, pulse 97, temperature 98.5 F (36.9 C), temperature source Oral, resp. rate 18, height 5\' 8"  (1.727 m), weight 76.658 kg (169 lb), SpO2 100 %.Body mass index is 25.7 kg/(m^2).  General Appearance: Disheveled  Eye SolicitorContact::  Fair  Speech:  Clear and Coherent  Volume:  Decreased  Mood:  Depressed and Hopeless  Affect:  Anxious  Thought Process:  Coherent  Orientation:  Full (Time, Place, and Person)  Thought Content:  Hallucinations: Auditory  Suicidal Thoughts:  Yes.  without intent/plan  Homicidal Thoughts:  No  Memory:  Immediate;   Good Recent;    Fair Remote;   Fair  Judgement:  Impaired  Insight:  Fair  Psychomotor Activity:  Tremor  Concentration:  Fair  Recall:  Fis[employer]Fair  Fund of Knowledge:Good  Language: Good  Akathisia:  No  Handed:  Right  AIMS (if indicated):     Assets:  Communication Skills Desire for Improvement Physical Health Resilience  Sleep:  Number of Hours: 3.5   Musculoskeletal: Strength & Muscle Tone: within normal limits Gait & Station: normal Patient leans: N/A  Current Medications: Current Facility-Administered Medications  Medication Dose Route Frequency Provider Last Rate Last Dose  . alum & mag hydroxide-simeth (MAALOX/MYLANTA) 200-200-20 MG/5ML suspension 30 mL  30 mL Oral Q4H PRN Shuvon Rankin, NP      . benzocaine (ORAJEL) 10 % mucosal gel   Mouth/Throat TID PRN Fransisca KaufmannLaura Navi Erber, NP      . feeding supplement (ENSURE COMPLETE) (ENSURE COMPLETE) liquid 237 mL  237 mL Oral BID BM Tilda FrancoLindsey Baker, RD   237 mL at 08/25/14 1011  . ibuprofen (ADVIL,MOTRIN) tablet 600 mg  600 mg Oral Q6H PRN Kerry HoughSpencer E Simon, PA-C   600 mg at 08/25/14 0751  . magnesium hydroxide (MILK OF MAGNESIA) suspension 30 mL  30 mL Oral Daily PRN Shuvon Rankin, NP      . nicotine (NICODERM CQ - dosed in mg/24 hours) patch 21 mg  21 mg Transdermal Daily Kerry HoughSpencer E Simon, PA-C   21 mg at 08/25/14 0755  . penicillin v potassium (VEETID) tablet 500 mg  500 mg Oral 3 times per day Fransisca KaufmannLaura Tom Macpherson, NP      . QUEtiapine (SEROQUEL) tablet 200 mg  200 mg Oral QHS Fransisca KaufmannLaura Takiya Belmares, NP      . thiamine (VITAMIN B-1) tablet 100 mg  100 mg Oral Daily Shuvon Rankin, NP   100 mg at 08/25/14 0751   Or  . thiamine (B-1) injection 100 mg  100 mg Intravenous Daily Shuvon Rankin, NP        Lab Results: No results found for this or any previous visit (from the past 48 hour(s)).  Physical Findings: AIMS: Facial and Oral Movements Muscles of Facial Expression: None, normal Lips and Perioral Area: None, normal Jaw: None, normal Tongue: None, normal,Extremity  Movements Upper (arms, wrists, hands, fingers): None, normal Lower (legs, knees, ankles, toes): None, normal, Trunk Movements Neck, shoulders, hips: None, normal, Overall Severity Severity of abnormal movements (highest score from questions above): None, normal Incapacitation due to abnormal movements: None, normal Patient's awareness of abnormal movements (rate only patient's report): No Awareness, Dental Status Current problems with teeth and/or dentures?: Yes Does patient usually wear dentures?: No  CIWA:  CIWA-Ar Total: 2 COWS:  COWS Total Score: 4  Treatment Plan Summary: Daily contact with patient to assess and evaluate symptoms and progress in treatment Medication management  Plan: 1. Continue crisis management and stabilization.  2. Medication management:  -Ativan taper for alcohol detox completed  -Increase Seroquel to 200 mg hs for insomnia/mood control 3. Encouraged patient to attend groups and participate in group counseling sessions and activities.  4. Discharge plan in progress.  5. Continue current treatment plan.  6. Address health issues: Continue motrin 600 mg prn dental pain. Start Penicillin V 500 mg every eight hours for five days for dental infection.   Medical Decision Making Problem Points:  Established problem, stable/improving (1), Review of last therapy session (1) and Review of psycho-social stressors (1) Data Points:  Review of medication regiment & side effects (2) Review of new medications or change in dosage (2)  I certify that inpatient services furnished can reasonably be expected to improve the patient's condition.   Kara Mierzejewski NP-C 08/25/2014, 2:18 PM

## 2014-08-25 NOTE — [facility] Group Notes (Signed)
[facility] Group Notes:  (Nursing/MHT/Case Management/Adjunct)  Date:  08/25/2014  Time:  12:45 PM  Type of Therapy:  Psychoeducational Skills  Participation Level:  Did Not Attend  Participation Quality:  Did Not Attend  Affect:  Did Not Attend  Cognitive:  Did Not Attend  Insight:  None  Engagement in Group:  Did Not Attend  Modes of Intervention:  Did Not Attend  Summary of Progress/Problems: Pt did not attend patient self inventory group.   Jacquelyne BalintForrest, Ryshawn Sanzone Shanta 08/25/2014, 12:45 PM

## 2014-08-26 NOTE — [facility] Group Notes (Signed)
[facility] LCSW Group Therapy  08/26/2014 10:00 AM   Type of Therapy:  Group Therapy  Participation Level:  Did Not Attend  Reyes IvanChelsea Horton, LCSW 08/26/2014 11:50 AM

## 2014-08-26 NOTE — [facility] Group Notes (Signed)
[facility] Group Notes:  (Nursing/MHT/Case Management/Adjunct)  Date:  08/26/2014  Time:  1:52 PM  Type of Therapy:  Psychoeducational Skills  Participation Level:  Did Not Attend  Participation Quality:  Did Not Attend  Affect:  Did Not Attend  Cognitive:  Did Not Attend  Insight:  None  Engagement in Group:  Did Not Attend  Modes of Intervention:  Did Not Attend  Summary of Progress/Problems: Pt did not attend patient self inventory group.   Jacquelyne BalintForrest, Hosanna Betley Shanta 08/26/2014, 1:52 PM

## 2014-08-26 NOTE — Progress Notes (Signed)
Patient did attend the first half of the evening speaker AA meeting but then returned to his room.

## 2014-08-26 NOTE — Progress Notes (Signed)
Psychoeducational Group Note  Date:  08/26/2014 Time:  1015  Group Topic/Focus:  Making Healthy Choices:   The focus of this group is to help patients identify negative/unhealthy choices they were using prior to admission and identify positive/healthier coping strategies to replace them upon discharge.  Participation Level:  Active  Participation Quality:  Appropriate  Affect:  Appropriate  Cognitive:  Oriented  Insight:  Improving  Engagement in Group:  Engaged  Additional Comments:  Interacted appropriately. States that knowledge was gained  [name] 08/26/2014  

## 2014-08-26 NOTE — Progress Notes (Signed)
Patient ID: Vincent Gallegos, male   DOB: 1966-08-21, 48 y.o.   MRN: [id number]  [facility]  08/26/2014 1:04 PM Vincent Gallegos  MRN:  [id number] Subjective:   Patient states "I did sleep better on the higher dose of Seroquel last night. My tooth is still bothering me pretty bad. I'm just concerned about getting to [place] when I leave."  Objective:  Patient is seen and chart is reviewed. The patient reports less depression and suicidal thoughts. The patient is hopeful that he can go to [place] after discharge to reconnect with family. Vincent Gallegos is compliant with medications and denies adverse side effects.  Patient contracts for safety on the unit. The patient continues to sleep through the morning groups reporting that his tooth pain disrupts his sleep. Nursing staff reports that the patient continues to be irritable at times and is focused on his discharge plans. He denies experiencing any hallucinations since being started on Seroquel.   Diagnosis:   DSM5:  Total Time spent with patient: 15 minutes Schizophrenia Disorders: NA Obsessive-Compulsive Disorders: NA Trauma-Stressor Disorders: NA Substance/Addictive Disorders: Alcohol related use disorder, severe, Cannabis dependence, Cocaine dependence Depressive Disorders: Major Depressive Disorder - with Psychotic Features (296.24)  AXIS I: Alcohol dependence, cocaine dependence, THC dependence,Major Depressive Disorder - with Psychotic Features (296.24)  AXIS II: Deferred AXIS III:  Past Medical History  Diagnosis Date  . Hypertension    AXIS IV: other psychosocial or environmental problems and substance dependence issues AXIS V: 41-50  ADL's:  Intact  Sleep: Fair  Appetite:  Fair  Suicidal Ideation:  Denies Homicidal Ideation:  Denies currently  AEB (as evidenced by):  Psychiatric Specialty Exam: Physical Exam  Review of Systems  Constitutional: Negative for fever, chills, weight loss, malaise/fatigue  and diaphoresis.  HENT: Negative for congestion, ear discharge, ear pain, hearing loss, nosebleeds, sore throat and tinnitus.        Patient complains of tooth pain.   Eyes: Negative for blurred vision, double vision, photophobia, pain, discharge and redness.  Respiratory: Negative for cough, hemoptysis, sputum production, shortness of breath, wheezing and stridor.   Cardiovascular: Negative for chest pain, palpitations, orthopnea, claudication, leg swelling and PND.  Gastrointestinal: Negative for heartburn, nausea, vomiting, abdominal pain, diarrhea, constipation, blood in stool and melena.  Genitourinary: Negative for dysuria, urgency, frequency, hematuria and flank pain.  Musculoskeletal: Negative for myalgias, back pain, joint pain, falls and neck pain.  Skin: Negative for itching and rash.  Neurological: Positive for tremors. Negative for dizziness, tingling, sensory change, speech change, focal weakness, seizures, loss of consciousness, weakness and headaches.  Endo/Heme/Allergies: Negative for environmental allergies and polydipsia. Does not bruise/bleed easily.  Psychiatric/Behavioral: Positive for depression, suicidal ideas and substance abuse (UDS positive for cocaine and marijuana ). The patient is nervous/anxious.     Blood pressure 118/78, pulse 97, temperature 98.5 F (36.9 C), temperature source Oral, resp. rate 18, height 5\' 8"  (1.727 m), weight 76.658 kg (169 lb), SpO2 100 %.Body mass index is 25.7 kg/(m^2).  General Appearance: Casual  Eye Contact::  Fair  Speech:  Clear and Coherent  Volume:  Normal  Mood:  Euthymic  Affect:  Anxious  Thought Process:  Coherent  Orientation:  Full (Time, Place, and Person)  Thought Content:  Rumination  Suicidal Thoughts:  No  Homicidal Thoughts:  No  Memory:  Immediate;   Good Recent;   Fair Remote;   Fair  Judgement:  Impaired  Insight:  Fair  Psychomotor Activity:  Tremor  Concentration:  Fair  Recall:  Vincent Gallegos of  Knowledge:Good  Language: Good  Akathisia:  No  Handed:  Right  AIMS (if indicated):     Assets:  Communication Skills Desire for Improvement Physical Health Resilience  Sleep:  Number of Hours: 5   Musculoskeletal: Strength & Muscle Tone: within normal limits Gait & Station: normal Patient leans: N/A  Current Medications: Current Facility-Administered Medications  Medication Dose Route Frequency Provider Last Rate Last Dose  . alum & mag hydroxide-simeth (MAALOX/MYLANTA) 200-200-20 MG/5ML suspension 30 mL  30 mL Oral Q4H PRN Shuvon Rankin, NP      . amLODipine (NORVASC) tablet 5 mg  5 mg Oral Daily Fransisca Kaufmann, NP   5 mg at 08/26/14 1251  . benzocaine (ORAJEL) 10 % mucosal gel   Mouth/Throat TID PRN Fransisca Kaufmann, NP      . feeding supplement (ENSURE COMPLETE) (ENSURE COMPLETE) liquid 237 mL  237 mL Oral BID BM Tilda Franco, RD   237 mL at 08/26/14 1252  . ibuprofen (ADVIL,MOTRIN) tablet 600 mg  600 mg Oral Q6H PRN Kerry Hough, PA-C   600 mg at 08/26/14 1257  . magnesium hydroxide (MILK OF MAGNESIA) suspension 30 mL  30 mL Oral Daily PRN Shuvon Rankin, NP      . nicotine (NICODERM CQ - dosed in mg/24 hours) patch 21 mg  21 mg Transdermal Daily Kerry Hough, PA-C   21 mg at 08/25/14 0755  . penicillin v potassium (VEETID) tablet 500 mg  500 mg Oral 3 times per day Fransisca Kaufmann, NP   500 mg at 08/26/14 1252  . QUEtiapine (SEROQUEL) tablet 200 mg  200 mg Oral QHS Fransisca Kaufmann, NP   200 mg at 08/25/14 2228  . thiamine (VITAMIN B-1) tablet 100 mg  100 mg Oral Daily Shuvon Rankin, NP   100 mg at 08/26/14 1252   Or  . thiamine (B-1) injection 100 mg  100 mg Intravenous Daily Shuvon Rankin, NP        Lab Results: No results found for this or any previous visit (from the past 48 hour(s)).  Physical Findings: AIMS: Facial and Oral Movements Muscles of Facial Expression: None, normal Lips and Perioral Area: None, normal Jaw: None, normal Tongue: None, normal,Extremity  Movements Upper (arms, wrists, hands, fingers): None, normal Lower (legs, knees, ankles, toes): None, normal, Trunk Movements Neck, shoulders, hips: None, normal, Overall Severity Severity of abnormal movements (highest score from questions above): None, normal Incapacitation due to abnormal movements: None, normal Patient's awareness of abnormal movements (rate only patient's report): No Awareness, Dental Status Current problems with teeth and/or dentures?: Yes Does patient usually wear dentures?: No  CIWA:  CIWA-Ar Total: 2 COWS:  COWS Total Score: 4  Treatment Plan Summary: Daily contact with patient to assess and evaluate symptoms and progress in treatment Medication management  Plan: 1. Continue crisis management and stabilization.  2. Medication management:  -Continue Seroquel  200 mg hs for insomnia/mood control 3. Encouraged patient to attend groups and participate in group counseling sessions and activities.  4. Discharge plan in progress.  5. Continue current treatment plan.  6. Address health issues: Continue motrin 600 mg prn dental pain. Continue Penicillin V 500 mg every eight hours for five days for dental infection.   Medical Decision Making Problem Points:  Established problem, stable/improving (1), Review of last therapy session (1) and Review of psycho-social stressors (1) Data Points:  Review of medication regiment & side effects (2) Review  of new medications or change in dosage (2)  I certify that inpatient services furnished can reasonably be expected to improve the patient's condition.   Anjuli Gemmill NP-C 08/26/2014, 1:04 PM

## 2014-08-26 NOTE — Progress Notes (Signed)
Vincent Gallegos refused to get OOB this morning but did agree to take his morning medications around lunchtime. He remains sad, flat and depressed.   A HE does not answer most of this nurses' assessment questions  But shares " I really want to get back to [place] and I don't know how I'm going to do this right now...."   R He takes medications as ordered. HE did complete his morning assessment and on it he writes he denies having SI within the  Past 24 hrs and he rates his  Depression, hopelessness and anxiety "5/0/5", respectively.

## 2014-08-26 NOTE — Progress Notes (Signed)
D   Pt expressed concern for his discharge and follow up plans and is concerned about getting to [place]   He has been a little irritable and interacts minimally with others    Pt reports feeling better and was not so focused on his discharge plans this evening A   Verbal support given   Medications administered and effectiveness monitored   Q 15 min checks R   Pt safe at present

## 2014-08-27 DIAGNOSIS — F1410 Cocaine abuse, uncomplicated: Secondary | ICD-10-CM

## 2014-08-27 DIAGNOSIS — F1010 Alcohol abuse, uncomplicated: Secondary | ICD-10-CM

## 2014-08-27 DIAGNOSIS — F1210 Cannabis abuse, uncomplicated: Secondary | ICD-10-CM

## 2014-08-27 DIAGNOSIS — F1994 Other psychoactive substance use, unspecified with psychoactive substance-induced mood disorder: Secondary | ICD-10-CM

## 2014-08-27 MED ORDER — QUETIAPINE FUMARATE 200 MG PO TABS: 200.0000 mg | ORAL_TABLET | Freq: Every day | ORAL | Status: DC

## 2014-08-27 MED ORDER — BENZOCAINE 10 % MT GEL: Freq: Three times a day (TID) | OROMUCOSAL | Status: DC | PRN

## 2014-08-27 MED ORDER — AMLODIPINE BESYLATE 5 MG PO TABS: 5.0000 mg | ORAL_TABLET | Freq: Every day | ORAL | Status: DC

## 2014-08-27 MED ORDER — PENICILLIN V POTASSIUM 500 MG PO TABS: 500.0000 mg | ORAL_TABLET | Freq: Three times a day (TID) | ORAL | Status: DC

## 2014-08-27 MED ORDER — IBUPROFEN 600 MG PO TABS: 600.0000 mg | ORAL_TABLET | Freq: Four times a day (QID) | ORAL | Status: DC | PRN

## 2014-08-27 NOTE — [facility] Suicide Risk Assessment (Signed)
Suicide Risk Assessment  Discharge Assessment     Demographic Factors:  Male  Total Time spent with patient: 30 minutes  Psychiatric Specialty Exam:     Blood pressure 106/68, pulse 91, temperature 97.9 F (36.6 C), temperature source Oral, resp. rate 20, height 5\' 8"  (1.727 m), weight 76.658 kg (169 lb), SpO2 100 %.Body mass index is 25.7 kg/(m^2).  General Appearance: Fairly Groomed  Patent attorneyye Contact::  Fair  Speech:  Clear and Coherent  Volume:  fluctuates  Mood:  Anxious, Irritable and states he wants to take care of his tooth as he is in pain  Affect:  anxious worried  Thought Process:  Coherent and Goal Directed  Orientation:  Full (Time, Place, and Person)  Thought Content:  plans as he moves on  Suicidal Thoughts:  No  Homicidal Thoughts:  No  Memory:  Immediate;   Fair Recent;   Fair Remote;   Fair  Judgement:  Fair  Insight:  Present and Shallow  Psychomotor Activity:  Restlessness  Concentration:  Fair  Recall:  FiservFair  Fund of Knowledge:Fair  Language: Fair  Akathisia:  No  Handed:    AIMS (if indicated):     Assets:  Desire for Improvement Resilience Others:  resourceful   Sleep:  Number of Hours: 4.25    Musculoskeletal: Strength & Muscle Tone: within normal limits Gait & Station: normal Patient leans: N/A   Mental Status Per Nursing Assessment::   On Admission:     Current Mental Status by Physician: In full contact with reality. There are no active S/S of withdrawal. There are no active SI, plans or intent. He states he wants to see a dentist and take care of his tooth ache. States he has something stuck there and wants it out. States he will eventually made it to [place] but not right now.   Loss Factors: Decline in physical health  Historical Factors: NA  Risk Reduction Factors:   wants to do better, he is resilient, resourceful   Continued Clinical Symptoms:  Alcohol/Substance Abuse/Dependencies  Cognitive Features That Contribute To  Risk:  Closed-mindedness Polarized thinking Thought constriction (tunnel vision)    Suicide Risk:  Minimal: No identifiable suicidal ideation.  Patients presenting with no risk factors but with morbid ruminations; may be classified as minimal risk based on the severity of the depressive symptoms  Discharge Diagnoses:   AXIS I:  Alcohol, Cocaine, Cannabis Abuse, Substance Induced Mood Disorder AXIS II:  No diagnosis AXIS III:   Past Medical History  Diagnosis Date  . Hypertension    AXIS IV:  other psychosocial or environmental problems AXIS V:  61-70 mild symptoms  Plan Of Care/Follow-up recommendations:  Activity:  as tolerated Diet:  regular Follow up outpatient basis Is patient on multiple antipsychotic therapies at discharge:  No   Has Patient had three or more failed trials of antipsychotic monotherapy by history:  No  Recommended Plan for Multiple Antipsychotic Therapies: NA    Afton Lavalle A 08/27/2014, 2:49 PM

## 2014-08-27 NOTE — Plan of Care (Signed)
Problem: Alteration in mood & ability to function due to Goal: STG: Patient verbalizes decreases in signs of withdrawal Outcome: Adequate for Discharge Pt denies withdrawal symptoms Goal: STG-Patient will attend groups Outcome: Progressing Pt attends most groups  Goal: STG-Patient will comply with prescribed medication regimen (Patient will comply with prescribed medication regimen)  Pt takes his medications as prescribed   Problem: Ineffective individual coping Goal: LTG: Patient will report a decrease in negative feelings Outcome: Progressing Pt reports more positive outlook Goal: STG: Patient will remain free from self harm Outcome: Progressing Pt is free from any self harm thoughts and behaviors

## 2014-08-27 NOTE — Progress Notes (Signed)
D   Pt expressed concern for his discharge and follow up plans and is concerned about getting to [place]   He has been a little irritable and interacts minimally with others    Pt reports feeling better and was not so focused on his discharge plans this evening A   Verbal support given   Medications administered and effectiveness monitored   Q 15 min checks R   Pt safe at present  Pt still complains of tooth pain and is requesting to go to the dental clinic   He left group early   He appears to be brighter and has taken care of his appearance   Continue to administer medications and monitor for effectivness Pt is safe

## 2014-08-27 NOTE — Discharge Summary (Signed)
Physician Discharge Summary Note  Patient:  Vincent Gallegos is an 48 y.o., male MRN:  [id number] DOB:  1966/03/06 Patient phone:  [phone] (home)  Patient address:   [address]  [place] [address],  Total Time spent with patient: 30 minutes  Date of Admission:  08/21/2014 Date of Discharge: 08/27/14  Reason for Admission:  Mood stabilization treatments  Discharge Diagnoses: Active Problems:   Substance induced mood disorder  Psychiatric Specialty Exam: Physical Exam  Psychiatric: He has a normal mood and affect. His speech is normal and behavior is normal. Judgment and thought content normal. Cognition and memory are normal.    Review of Systems  Constitutional: Negative.   HENT: Negative.        Complains of left sided dental pain.   Eyes: Negative.   Respiratory: Negative.   Cardiovascular: Negative.   Gastrointestinal: Negative.   Genitourinary: Negative.   Musculoskeletal: Negative.   Skin: Negative.   Neurological: Negative.   Endo/Heme/Allergies: Negative.   Psychiatric/Behavioral: Positive for depression (Stabilized with treatments) and substance abuse (Stabilized with treatments). Negative for suicidal ideas, hallucinations and memory loss. The patient is nervous/anxious. The patient does not have insomnia.     Blood pressure 106/68, pulse 91, temperature 97.9 F (36.6 C), temperature source Oral, resp. rate 20, height 5\' 8"  (1.727 m), weight 76.658 kg (169 lb), SpO2 100 %.Body mass index is 25.7 kg/(m^2).  See Physician SRA     Past Psychiatric History: Diagnosis: Bipolar disorder  Hospitalizations: [facility] in 2014  Outpatient Care: [facility] in [place], previously, [place]  Substance Abuse Care: None reported  Self-Mutilation: Denies  Suicidal Attempts: Yes, by overdose 5 years ago  Violent Behaviors: Denies   Musculoskeletal: Strength & Muscle Tone: within normal limits Gait & Station: normal Patient leans:  N/A  DSM5: Axis Diagnosis:  AXIS I: Alcohol, Cocaine, Cannabis Abuse, Substance Induced Mood Disorder AXIS II: No diagnosis AXIS III:  Past Medical History  Diagnosis Date  . Hypertension    AXIS IV: other psychosocial or environmental problems AXIS V: 61-70 mild symptoms  Level of Care:  OP  Hospital Course:  Vincent Gallegos is a 48 year old African-American male. He reports, "I went to hospital yesterday because I was feeling like killing myself or somebody else. I have been feeling very depressed for a while. My worsening depression has been going on x a month or so. I go to the [facility] here in [place] for treatment. They put me on Latuda because I have Bipolar. Kasandra Knudsen is not doing a thing for me. I have been on it x 2 months. I was sober from alcohol & drugs for 6 months. I live at the [facility]. But, my medicine is not helping my problem. I hear voices. So, I relapsed to feel better, became homeless, unemployed and depressed, became SI/HI. Decided it is better to go to the hospital that to hurt myself or somebody else".         Vincent Gallegos was admitted to the adult unit. He was evaluated and his symptoms were identified. Medication management was discussed and initiated. Patient was started on Seroquel to help with complaints of insomnia and mood instability. The initial dose was started at 50 mg but was gradually titrated to 200 mg. This dose was reported to be effective per the patient.  He was oriented to the unit and encouraged to participate in unit programming. Medical problems were identified and treated appropriately. Patient was started on Norvasc 5  mg daily for elevated blood pressure. He was also ordered motrin prn and orajel as needed to help with complaints of dental pain. Patient was started on Penicillin V as was ordered for him on a previous ED visit for dental pain due to increased swelling. Home medication was restarted as needed.        The patient was evaluated  each day by a clinical provider to ascertain the patient's response to treatment.  Improvement was noted by the patient's report of decreasing symptoms, improved sleep and appetite, affect, medication tolerance, behavior, and participation in unit programming.  He was asked each day to complete a self inventory noting mood, mental status, pain, new symptoms, anxiety and concerns. Patient was noted to have a very irritable mood, especially in the morning. The patient would often not start attending groups until the afternoon.          He responded well to medication and being in a therapeutic and supportive environment. Positive and appropriate behavior was noted and the patient was motivated for recovery.  The patient worked closely with the treatment team and case manager to develop a discharge plan with appropriate goals. Coping skills, problem solving as well as relaxation therapies were also part of the unit programming. Patient remained fixated on his dental discomfort although he often did not appear to be in any distress. He at one point demanded that [facility] Dent[facility] to [facility] his pain.          By the day of discharge he was in much improved condition than upon admission. Patient felt his mental health symptoms had stabilized but wanting to leave in order to locate dental care.  Symptoms were reported as significantly decreased or resolved completely.  The patient denied SI/HI and voiced no AVH. He was motivated to continue taking medication with a goal of continued improvement in mental health.  Vincent MiyamotoArnez Gallegos was discharged home with a plan to follow up as noted below. Patient was provided with sample medications and prescriptions. He expressed intent to find a Dentist on his own to resolve his pain.   Consults:  psychiatry  Significant Diagnostic Studies:  Chemistry panel, CBC, UDS positive for cocaine and marijuana   Discharge Vitals:   Blood pressure 106/68, pulse 91,  temperature 97.9 F (36.6 C), temperature source Oral, resp. rate 20, height 5\' 8"  (1.727 m), weight 76.658 kg (169 lb), SpO2 100 %. Body mass index is 25.7 kg/(m^2). Lab Results:   No results found for this or any previous visit (from the past 72 hour(s)).  Physical Findings: AIMS: Facial and Oral Movements Muscles of Facial Expression: None, normal Lips and Perioral Area: None, normal Jaw: None, normal Tongue: None, normal,Extremity Movements Upper (arms, wrists, hands, fingers): None, normal Lower (legs, knees, ankles, toes): None, normal, Trunk Movements Neck, shoulders, hips: None, normal, Overall Severity Severity of abnormal movements (highest score from questions above): None, normal Incapacitation due to abnormal movements: None, normal Patient's awareness of abnormal movements (rate only patient's report): No Awareness, Dental Status Current problems with teeth and/or dentures?: Yes Does patient usually wear dentures?: No  CIWA:  CIWA-Ar Total: 2 COWS:  COWS Total Score: 4  Psychiatric Specialty Exam: See Psychiatric Specialty Exam and Suicide Risk Assessment completed by Attending Physician prior to discharge.  Discharge destination:  Home  Is patient on multiple antipsychotic therapies at discharge:  No   Has Patient had three or more failed trials of antipsychotic monotherapy by history:  No  Recommended Plan for Multiple Antipsychotic Therapies: NA      Discharge Instructions    Discharge instructions    Complete by:  As directed   Follow up with your Dentist for evaluation of dental pain.            Medication List    STOP taking these medications        LATUDA 60 MG Tabs  Generic drug:  Lurasidone HCl      TAKE these medications      Indication   acetaminophen 500 MG tablet  Commonly known as:  TYLENOL  Take 500 mg by mouth every 6 (six) hours as needed (tooth pain).      amLODipine 5 MG tablet  Commonly known as:  NORVASC  Take 1 tablet (5  mg total) by mouth daily.   Indication:  High Blood Pressure     benzocaine 10 % mucosal gel  Commonly known as:  ORAJEL  Use as directed in the mouth or throat 3 (three) times daily as needed for mouth pain.   Indication:  dental discomfort     ibuprofen 600 MG tablet  Commonly known as:  ADVIL,MOTRIN  Take 1 tablet (600 mg total) by mouth every 6 (six) hours as needed (tooth pain).      penicillin v potassium 500 MG tablet  Commonly known as:  VEETID  Take 1 tablet (500 mg total) by mouth every 8 (eight) hours.   Indication:  Dental abscess     QUEtiapine 200 MG tablet  Commonly known as:  SEROQUEL  Take 1 tablet (200 mg total) by mouth at bedtime.   Indication:  Trouble Sleeping, Mood control       Follow-up Information    [facility]2014.   Specialty:  Behavioral Health   Why:  Please [facility] Monday-Friday between 8 am to 3 pm for therapy and medication management assessment.   Contact informationElpidio Eric:   [address] [place] [address] [phone]       Follow-up recommendations:   Activity: as tolerated Diet: regular Follow up outpatient basis  Comments:   Take all your medications as prescribed by your mental healthcare provider.  Report any adverse effects and or reactions from your medicines to your outpatient provider promptly.  Patient is instructed and cautioned to not engage in alcohol and or illegal drug use while on prescription medicines.  In the event of worsening symptoms, patient is instructed to call the crisis hotline, 911 and or go to the nearest ED for appropriate evaluation and treatment of symptoms.  Follow-up with your primary care provider for your other medical issues, concerns and or health care needs.   Total Discharge Time:  Greater than 30 minutes.  SignedFransisca Kaufmann: DAVIS, LAURA NP-C 08/27/2014, 3:08 PM  I personally assessed the patient and formulated the plan Madie RenoIrving A. Dub MikesLugo, M.D.

## 2014-08-27 NOTE — Progress Notes (Signed)
Patient ID: Vincent Gallegos, male   DOB: 1966/02/08, 48 y.o.   MRN: [id number]  DAR: Pt. 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 Patient did not fill out daily inventory sheet although encouraged. Patient reports pain in tooth and states that it has been going on for awhile. Patient is receiving medication for this however states that it is not helping. MD was notified of this. Support and encouragement provided to the patient. Patient able to voice his concerns or questions to staff. Scheduled medications administered to patient per physician's orders however patient reported that he would get them later and did this morning. Patient is assertive but minimal in interaction and does not appear vested in treatment at this time, as evidenced by not attending some groups and laying in bed when medications are to be administered. Q15 minute checks are maintained for safety.

## 2014-08-27 NOTE — Clinical Social Work Note (Signed)
CSW spoke w Liana CrockerM Fryer, PATH program worker at [facility] [place].  Patient is active w that program, is applying for disability through them, and is welcome to attend any programs at [facility].  However, due to patient's past behavior, he is banned from the [employer] and has an upcoming court date to deal w inappropriate behavior at [employer].  [facility] personnel cannot help patient get to [place] [facility] [place] [facility] [place] [place] and do not recommend that patient be given any transportation assistance.  States that all bus routes go through [employer], including [employer] bus, and patient cannot be at [employer].  Recommends that patient be advised that he can walk to collect his belongings.   Says that patient has been offered help getting to [place] in past and has not followed through.  Has received much help w stabilization and none of these efforts have been fruitful.  Says that patient has difficulty w accepting consequences of his behaviors. At this time, patient is not eligible for assistance from [facility] other than attending their programs and being part of their community.  Santa GeneraAnne Kemani Demarais, LCSW Clinical Social Worker

## 2014-08-27 NOTE — Plan of Care (Signed)
Problem: Diagnosis: Increased Risk For Suicide Attempt Goal: LTG-Patient Will Report Improved Mood and Deny Suicidal LTG (by discharge) Patient will report improved mood and deny suicidal ideation.  Outcome: Completed/Met Date Met:  08/27/14 Patient denies suicidal ideation.

## 2014-08-27 NOTE — Progress Notes (Signed)
Patient ID: Vincent Gallegos, male   DOB: July 10, 1966, 48 y.o.   MRN: [id number]  Pt. Denies SI/HI and A/V hallucinations. Belongings returned to patient at time of discharge. Patient denies any pain or discomfort. Discharge instructions and medications were reviewed with patient. Patient verbalized understanding of both medications and discharge instructions. Q15 minute safety checks until discharge. No distress upon discharge.

## 2014-08-27 NOTE — Clinical Social Work Note (Signed)
CSW met with patient to discuss discharge plans. Patient reports experiencing high level of tooth pain, was irritable and began raising his voice. CSW informed patient that hospital will likely not be able to provide transportation assistance to [place] due to him being banned from [employer]. Patient reports that he is no longer banned and has resolved his court issues. CSW agreed to inquire about possibility of assistance due to patient being able to use the [employer]. Patient discussed his need to go to [facility] and [employer] to get his belongings. Patient was observed attempting to contact his [facility] Path case manager, and became visibly upset and irritable, stating that the line was busy. Patient reports that he wants to discharge today and does not wish to stay until tomorrow. He is complaining of tooth pain and wants to leave. CSW will provide patient with information on outpatient resources and bus passes.  Tilden Fossa, MSW, [place] Worker [facility] [phone]

## 2014-08-28 NOTE — Progress Notes (Signed)
[facility] Adult Case Management Discharge Plan :  Will you be returning to the same living situation after discharge: Yes,  patient plans to return to the shelter At discharge, do you have transportation home?:Yes,  patient was provided with bus passes Do you have the ability to pay for your medications:Yes,  patient will be provided with medication samples and prescriptions  Release of information consent forms completed and in the chart;  Patient's signature needed at discharge.  Patient to Follow up at: Follow-up Information    [facility].   Specialty:  Behavioral Health   Why:  Please [facility] Monday-Friday between 8 am to 3 pm for therapy and medication management assessment.   Contact information:   [address] [place] [address] [phone]       Patient denies SI/HI:   Yes,  denies    Safety Planning and Suicide Prevention discussed:  Yes,  with patient and his uncle    Vincent Gallegos, Vincent Gallegos L 08/28/2014, 12:51 PM

## 2014-08-28 NOTE — [facility] Group Notes (Signed)
LATE NOTE FROM 12/28:    [facility] L[facility] Discharge Planning Group Note  08/27/14  8:45 AM   Participation Quality: Alert, Appropriate and Oriented  Mood/Affect: Anxious  Depression Rating: 3  Anxiety Rating: 7  Thoughts of Suicide: Pt denies SI/HI  Will you contract for safety? Yes  Current AVH: Pt denies  Plan for Discharge/Comments: Pt attended discharge planning group and actively participated in group. CSW provided pt with today's workbook. Patient reports feeling anxious about discharging to [place]. CSW informed patient that he would be provided with an update following progression team meeting.  Transportation Means: CSW continuing to assess  Supports: No supports mentioned at this time  Samuella BruinKristin Tommey Barret, MSW, [employer]CSWA Clinical Social Worker [facility] [phone]

## 2014-08-29 ENCOUNTER — Observation Stay (HOSPITAL_COMMUNITY)
Admission: AD | Admit: 2014-08-29 | Discharge: 2014-08-30 | Disposition: A | Discharge status: Home / Self Care | Payer: Federal, State, Local not specified - Other | Source: Intra-hospital | Attending: Psychiatry | Admitting: Psychiatry

## 2014-08-29 ENCOUNTER — Emergency Department (HOSPITAL_COMMUNITY)
Admission: EM | Admit: 2014-08-29 | Discharge: 2014-08-29 | Disposition: A | Discharge status: Other Acute Inpatient Hospital | Payer: Self-pay | Attending: Emergency Medicine | Admitting: Emergency Medicine

## 2014-08-29 ENCOUNTER — Encounter (HOSPITAL_COMMUNITY): Payer: Self-pay

## 2014-08-29 ENCOUNTER — Encounter (HOSPITAL_COMMUNITY): Payer: Self-pay | Admitting: Emergency Medicine

## 2014-08-29 DIAGNOSIS — F1994 Other psychoactive substance use, unspecified with psychoactive substance-induced mood disorder: Secondary | ICD-10-CM

## 2014-08-29 DIAGNOSIS — F329 Major depressive disorder, single episode, unspecified: Secondary | ICD-10-CM | POA: Diagnosis present

## 2014-08-29 DIAGNOSIS — F1420 Cocaine dependence, uncomplicated: Secondary | ICD-10-CM | POA: Insufficient documentation

## 2014-08-29 DIAGNOSIS — R45851 Suicidal ideations: Secondary | ICD-10-CM | POA: Insufficient documentation

## 2014-08-29 DIAGNOSIS — G8929 Other chronic pain: Secondary | ICD-10-CM | POA: Insufficient documentation

## 2014-08-29 DIAGNOSIS — F32A Depression, unspecified: Secondary | ICD-10-CM | POA: Diagnosis present

## 2014-08-29 DIAGNOSIS — Z79899 Other long term (current) drug therapy: Secondary | ICD-10-CM | POA: Insufficient documentation

## 2014-08-29 DIAGNOSIS — F319 Bipolar disorder, unspecified: Secondary | ICD-10-CM | POA: Insufficient documentation

## 2014-08-29 DIAGNOSIS — Z590 Homelessness: Secondary | ICD-10-CM | POA: Insufficient documentation

## 2014-08-29 DIAGNOSIS — F1099 Alcohol use, unspecified with unspecified alcohol-induced disorder: Secondary | ICD-10-CM | POA: Insufficient documentation

## 2014-08-29 DIAGNOSIS — K0381 Cracked tooth: Secondary | ICD-10-CM | POA: Insufficient documentation

## 2014-08-29 DIAGNOSIS — F1220 Cannabis dependence, uncomplicated: Secondary | ICD-10-CM | POA: Insufficient documentation

## 2014-08-29 DIAGNOSIS — I10 Essential (primary) hypertension: Secondary | ICD-10-CM | POA: Insufficient documentation

## 2014-08-29 DIAGNOSIS — F419 Anxiety disorder, unspecified: Secondary | ICD-10-CM | POA: Insufficient documentation

## 2014-08-29 DIAGNOSIS — Y909 Presence of alcohol in blood, level not specified: Secondary | ICD-10-CM | POA: Insufficient documentation

## 2014-08-29 DIAGNOSIS — F330 Major depressive disorder, recurrent, mild: Principal | ICD-10-CM | POA: Insufficient documentation

## 2014-08-29 DIAGNOSIS — Z720 Tobacco use: Secondary | ICD-10-CM | POA: Insufficient documentation

## 2014-08-29 DIAGNOSIS — R4584 Anhedonia: Secondary | ICD-10-CM | POA: Insufficient documentation

## 2014-08-29 DIAGNOSIS — F17210 Nicotine dependence, cigarettes, uncomplicated: Secondary | ICD-10-CM | POA: Insufficient documentation

## 2014-08-29 HISTORY — DX: Major depressive disorder, single episode, unspecified: F32.9

## 2014-08-29 HISTORY — DX: Bipolar disorder, unspecified: F31.9

## 2014-08-29 HISTORY — DX: Anxiety disorder, unspecified: F41.9

## 2014-08-29 HISTORY — DX: Depression, unspecified: F32.A

## 2014-08-29 HISTORY — DX: Unspecified abdominal hernia without obstruction or gangrene: K46.9

## 2014-08-29 LAB — COMPREHENSIVE METABOLIC PANEL
ALT: 59 U/L — ABNORMAL HIGH (ref 0–53)
ANION GAP: 11 (ref 5–15)
AST: 42 U/L — ABNORMAL HIGH (ref 0–37)
Albumin: 3.7 g/dL (ref 3.5–5.2)
Alkaline Phosphatase: 42 U/L (ref 39–117)
BILIRUBIN TOTAL: 0.5 mg/dL (ref 0.3–1.2)
BUN: 10 mg/dL (ref 6–23)
CALCIUM: 8.7 mg/dL (ref 8.4–10.5)
CHLORIDE: 105 meq/L (ref 96–112)
CO2: 22 mmol/L (ref 19–32)
Creatinine, Ser: 0.95 mg/dL (ref 0.50–1.35)
GFR calc non Af Amer: >90 mL/min (ref >90)
Glucose, Bld: 93 mg/dL (ref 70–99)
Potassium: 3.6 mmol/L (ref 3.5–5.1)
Sodium: 138 mmol/L (ref 135–145)
Total Protein: 6.6 g/dL (ref 6.0–8.3)

## 2014-08-29 LAB — CBC WITH DIFFERENTIAL/PLATELET
Basophils Absolute: 0.1 10*3/uL (ref 0.0–0.1)
Basophils Relative: 1 % (ref 0–1)
EOS PCT: 7 % — AB (ref 0–5)
Eosinophils Absolute: 0.3 10*3/uL (ref 0.0–0.7)
HEMATOCRIT: 41.3 % (ref 39.0–52.0)
HEMOGLOBIN: 13.9 g/dL (ref 13.0–17.0)
LYMPHS PCT: 40 % (ref 12–46)
Lymphs Abs: 1.9 10*3/uL (ref 0.7–4.0)
MCH: 30.2 pg (ref 26.0–34.0)
MCHC: 33.7 g/dL (ref 30.0–36.0)
MCV: 89.6 fL (ref 78.0–100.0)
MONOS PCT: 15 % — AB (ref 3–12)
Monocytes Absolute: 0.7 10*3/uL (ref 0.1–1.0)
NEUTROS ABS: 1.7 10*3/uL (ref 1.7–7.7)
Neutrophils Relative %: 37 % — ABNORMAL LOW (ref 43–77)
Platelets: 239 10*3/uL (ref 150–400)
RBC: 4.61 MIL/uL (ref 4.22–5.81)
RDW: 13.7 % (ref 11.5–15.5)
WBC: 4.6 10*3/uL (ref 4.0–10.5)

## 2014-08-29 LAB — ACETAMINOPHEN LEVEL

## 2014-08-29 LAB — RAPID URINE DRUG SCREEN, HOSP PERFORMED
Amphetamines: NOT DETECTED
BARBITURATES: NOT DETECTED
Benzodiazepines: NOT DETECTED
Cocaine: NOT DETECTED
Opiates: NOT DETECTED
TETRAHYDROCANNABINOL: NOT DETECTED

## 2014-08-29 LAB — ETHANOL: Alcohol, Ethyl (B): 91 mg/dL — ABNORMAL HIGH (ref 0–9)

## 2014-08-29 LAB — SALICYLATE LEVEL: Salicylate Lvl: <4 mg/dL (ref 2.8–20.0)

## 2014-08-29 MED ORDER — AMLODIPINE BESYLATE 5 MG PO TABS: 5.0000 mg | ORAL_TABLET | Freq: Every day | ORAL | Status: DC

## 2014-08-29 MED ORDER — QUETIAPINE FUMARATE 200 MG PO TABS
200.0000 mg | ORAL_TABLET | Freq: Every day | ORAL | Status: DC
  Administered 2014-08-29: 200 mg via ORAL
  Filled 2014-08-29 (×3): qty 1

## 2014-08-29 MED ORDER — PENICILLIN V POTASSIUM 500 MG PO TABS
500.0000 mg | ORAL_TABLET | Freq: Three times a day (TID) | ORAL | Status: DC
  Administered 2014-08-29 – 2014-08-30 (×3): 500 mg via ORAL
  Filled 2014-08-29: qty 2
  Filled 2014-08-29 (×4): qty 1
  Filled 2014-08-29: qty 2
  Filled 2014-08-29 (×2): qty 1
  Filled 2014-08-29: qty 2

## 2014-08-29 MED ORDER — IBUPROFEN 400 MG PO TABS: 600.0000 mg | ORAL_TABLET | Freq: Four times a day (QID) | ORAL | Status: DC | PRN

## 2014-08-29 MED ORDER — PENICILLIN V POTASSIUM 250 MG PO TABS: 500.0000 mg | ORAL_TABLET | Freq: Three times a day (TID) | ORAL | Status: DC

## 2014-08-29 MED ORDER — QUETIAPINE FUMARATE 200 MG PO TABS: 200.0000 mg | ORAL_TABLET | Freq: Every day | ORAL | Status: DC

## 2014-08-29 MED ORDER — IBUPROFEN 600 MG PO TABS
600.0000 mg | ORAL_TABLET | Freq: Four times a day (QID) | ORAL | Status: DC | PRN
  Administered 2014-08-29 – 2014-08-30 (×3): 600 mg via ORAL
  Filled 2014-08-29 (×3): qty 1

## 2014-08-29 MED ORDER — AMLODIPINE BESYLATE 5 MG PO TABS
5.0000 mg | ORAL_TABLET | Freq: Every day | ORAL | Status: DC
  Administered 2014-08-29 – 2014-08-30 (×2): 5 mg via ORAL
  Filled 2014-08-29 (×5): qty 1

## 2014-08-29 NOTE — ED Notes (Signed)
Pt arrives POV to ED stating he's been having thoughts of hurting himself. States a friend brought him here after he was thinking about overdosing on pills but didn't do it. Pt awake, alert, oriented x4, NAD. C\o some dental pain.
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
## 2014-08-29 NOTE — Progress Notes (Signed)
Patient Discharge Instructions:  [facility].  Per the SW the patient did not consent to [facility].  Jerelene ReddenSheena E [place], 08/29/2014, 3:29 PM

## 2014-08-29 NOTE — BH Assessment (Addendum)
Notified EDP Dr.Rancour and [facility] [place] [facility] [place]'s [facility] to observation unit bed 5. Pt's ED nurse will call report to obs unit @[phone] or [phone] prior to transport. Pt's nurse will have patient sign voluntary consent form.    Glorious PeachNajah Jatin Naumann, MS, LCASA Assessment Counselor

## 2014-08-29 NOTE — BH Assessment (Signed)
Spoke with Bonnetta BarryShelly Eisbach, NP whom reports that she attempted to assess patient and patient was uncooperative and refused to respond repeatedly.   This Clinical research associatewriter will attempt to assess patient.  Vincent PeachNajah Harol Shabazz, MS, LCASA Assessment Counselor
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
## 2014-08-29 NOTE — BH Assessment (Signed)
TTS consult in progress.   Skyeler Smola, MS, LCASA Assessment Counselor  

## 2014-08-29 NOTE — ED Notes (Signed)
[facility]. Patient speaking with [facility] couselor at this time via telepsych machine.

## 2014-08-29 NOTE — BH Assessment (Addendum)
Spoke with ED nurse Joni ReiningNicole who reported that patient is currently being tele-psyched by another clinician. As this Clinical research associatewriter was attempting to assess patient also.  Glorious PeachNajah Sharyon Peitz, MS, LCASA Assessment Counselor

## 2014-08-29 NOTE — Plan of Care (Signed)
[facility] Observation Crisis Plan  Reason for Crisis Plan:  Crisis Stabilization and Substance Abuse   Plan of Care:  Referral for Substance Abuse  Family Support:    none  Current Living Environment:  Living Arrangements: Other (Comment) [facility]  Insurance:   Hospital Account    Name Acct ID Class Status Primary Coverage   Vincent Gallegos, Vincent Gallegos [id number] BEHAVIORAL HEALTH OBSERVATION [facility] [place] [address]        Guarantor Account (for Hospital Account [id number])    Name Relation to Pt Service Area Active? Acct Type   Vincent Gallegos, Vincent Gallegos Self [facility] Yes Behavioral Health   Address Phone       [address]  [place], [place] [address] [phone](H)          Coverage Information (for Hospital Account [id number])    F/O Payor/Plan Precert #   [facility] [address]    Subscriber Subscriber #   Vincent Gallegos, Vincent Gallegos [id number]   Address Phone   [address] [place], [place] [address] [phone]      Legal Guardian:   none  Primary Care Provider:  No PCP Per Patient  Current Outpatient Providers:  unknown  Psychiatrist:     Counselor/Therapist:     Compliant with Medications:  Yes  Additional Information: Looking to go to potential long term placement or wants to go to another county to their homeless shelter   Vincent Gallegos, Vincent Gallegos 12/30/20152:21 PM

## 2014-08-29 NOTE — BH Assessment (Signed)
Spoke with ED RN Joni ReiningNicole who will set up tele-psych cart for assessment to begin.   Glorious PeachNajah Chiquita Heckert, MS, LCASA Assessment Counselor

## 2014-08-29 NOTE — ED Notes (Signed)
Patient [facility]psych [facility]

## 2014-08-29 NOTE — Progress Notes (Signed)
Pt alert and cooperative. Reports anxiety and depression "So many things going on at one time". -SI/HI, verbally contracts for safety. -A/[place]. Denies physical pain or discomfort @ present. Emotional support and encouragement given. Will continue to monitor closely and evaluate for stabilization.

## 2014-08-29 NOTE — Progress Notes (Signed)
[facility] INPATIENT:  Family/Significant Other Suicide Prevention Education  Suicide Prevention Education:  Patient Refusal for Family/Significant Other Suicide Prevention Education: The patient Vincent Gallegos has refused to provide written consent for family/significant other to be provided Family/Significant Other Suicide Prevention Education during admission and/or prior to discharge.  Physician notified.  Vincent Gallegos, Vincent Gallegos Vincent Gallegos 08/29/2014, 2:47 PM
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
## 2014-08-29 NOTE — ED Provider Notes (Signed)
CSN: [id number]     Arrival date & time 08/29/14  0740 History   First MD Initiated Contact with Patient 08/29/14 0745     Chief Complaint  Patient presents with  . Suicidal     (Consider location/radiation/quality/duration/timing/severity/associated sxs/prior Treatment) HPI Comments: Patient states he is suicidal and his friend stopped him from taking a number of unknown pills this morning.  He is having thoughts of hurting himself and others.  Denies hallucinations.  Notably, [facility].  States compliance with meds. Denies any alcohol or drug abuse.  Denies any medical complaints other than chronic tooth pain.  The history is provided by the patient.    Past Medical History  Diagnosis Date  . Hypertension   . Bipolar 1 disorder   . Depression   . Anxiety   . Hernia of abdominal cavity    History reviewed. No pertinent past surgical history. History reviewed. No pertinent family history. History  Substance Use Topics  . Smoking status: Current Every Day Smoker -- 0.50 packs/day for 20 years    Types: Cigarettes  . Smokeless tobacco: Never Used  . Alcohol Use: Yes     Comment: 5-6 24 ounce liquor drinks a day, 3 24 oz beers/day    Review of Systems  Constitutional: Negative for activity change and appetite change.  Respiratory: Negative for cough, chest tightness and shortness of breath.   Gastrointestinal: Negative for nausea, vomiting and abdominal pain.  Genitourinary: Negative for dysuria, hematuria and testicular pain.  Musculoskeletal: Negative for myalgias and arthralgias.  Skin: Negative for rash.  Neurological: Negative for dizziness, weakness, light-headedness and headaches.  Psychiatric/Behavioral: Positive for suicidal ideas, behavioral problems, self-injury, dysphoric mood and decreased concentration.  A complete 10 system review of systems was obtained and all systems are negative except as noted in the HPI and PMH.       Allergies  Review of patient's allergies indicates no known allergies.  Home Medications   Prior to Admission medications   Medication Sig Start Date End Date Taking? Authorizing Provider  ibuprofen (ADVIL,MOTRIN) 600 MG tablet Take 1 tablet (600 mg total) by mouth every 6 (six) hours as needed (tooth pain). 08/27/14  Yes Rachael FeeIrving A Lugo, MD  penicillin v potassium (VEETID) 500 MG tablet Take 1 tablet (500 mg total) by mouth every 8 (eight) hours. 08/27/14  Yes Fransisca KaufmannLaura Davis, NP  QUEtiapine (SEROQUEL) 200 MG tablet Take 1 tablet (200 mg total) by mouth at bedtime. 08/27/14  Yes Fransisca KaufmannLaura Davis, NP  acetaminophen (TYLENOL) 500 MG tablet Take 500 mg by mouth every 6 (six) hours as needed (tooth pain).    Historical Provider, MD  amLODipine (NORVASC) 5 MG tablet Take 1 tablet (5 mg total) by mouth daily. 08/27/14   Fransisca KaufmannLaura Davis, NP  benzocaine (ORAJEL) 10 % mucosal gel Use as directed in the mouth or throat 3 (three) times daily as needed for mouth pain. 08/27/14   Fransisca KaufmannLaura Davis, NP   BP 127/81 mmHg  Pulse 92  Temp(Src) 98 F (36.7 C) (Oral)  Resp 16  Ht 5\' 9"  (1.753 m)  Wt 170 lb (77.111 kg)  BMI 25.09 kg/m2  SpO2 96% Physical Exam  Constitutional: He is oriented to person, place, and time. He appears well-developed and well-nourished. No distress.  HENT:  Head: Normocephalic and atraumatic.  Mouth/Throat: Oropharynx is clear and moist. No oropharyngeal exudate.  Multiple broken teeth.  L upper molar with root exposed.  No abscess. Floor of mouth soft.  Eyes: Conjunctivae and EOM are normal. Pupils are equal, round, and reactive to light.  Neck: Normal range of motion. Neck supple.  No meningismus.  Cardiovascular: Normal rate, regular rhythm, normal heart sounds and intact distal pulses.   No murmur heard. Pulmonary/Chest: Effort normal and breath sounds normal. No respiratory distress.  Abdominal: Soft. There is no tenderness. There is no rebound and no guarding.  Musculoskeletal:  Normal range of motion. He exhibits no edema or tenderness.  Neurological: He is alert and oriented to person, place, and time. No cranial nerve deficit. He exhibits normal muscle tone. Coordination normal.  No ataxia on finger to nose bilaterally. No pronator drift. 5/5 strength throughout. CN 2-12 intact. Negative Romberg. Equal grip strength. Sensation intact. Gait is normal.   Skin: Skin is warm.  Psychiatric: He has a normal mood and affect. His behavior is normal.  Nursing note and vitals reviewed.   ED Course  Procedures (including critical care time) Labs Review Labs Reviewed  CBC WITH DIFFERENTIAL - Abnormal; Notable for the following:    Neutrophils Relative % 37 (*)    Monocytes Relative 15 (*)    Eosinophils Relative 7 (*)    All other components within normal limits  COMPREHENSIVE METABOLIC PANEL - Abnormal; Notable for the following:    AST 42 (*)    ALT 59 (*)    All other components within normal limits  ETHANOL - Abnormal; Notable for the following:    Alcohol, Ethyl (B) 91 (*)    All other components within normal limits  ACETAMINOPHEN LEVEL - Abnormal; Notable for the following:    Acetaminophen (Tylenol), Serum <10.0 (*)    All other components within normal limits  SALICYLATE LEVEL  URINE RAPID DRUG SCREEN (HOSP PERFORMED)    Imaging Review No results found.   EKG Interpretation None      MDM   Final diagnoses:  None   Suicidal with plan to overdose on pills. Recent hospitalization for the same.  Screening labs show mild alcohol intoxication and LFT elevation consistent with alcohol use. Patient with no abdominal pain. Acetaminophen level negative.  Patient seen by behavioral health and will be admitted to observation unit. Placement pending. Holding orders placed.   Glynn OctaveStephen Delanda Bulluck, MD 08/29/14 [phone]
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
## 2014-08-30 DIAGNOSIS — F332 Major depressive disorder, recurrent severe without psychotic features: Secondary | ICD-10-CM

## 2014-08-30 MED ORDER — QUETIAPINE FUMARATE 200 MG PO TABS: 200.0000 mg | ORAL_TABLET | Freq: Every day | ORAL | Status: DC

## 2014-08-30 MED ORDER — AMLODIPINE BESYLATE 5 MG PO TABS: 5.0000 mg | ORAL_TABLET | Freq: Every day | ORAL | Status: DC

## 2014-08-30 NOTE — Progress Notes (Signed)
Patient ID: Vincent Gallegos, male   DOB: Jan 23, 1966, 48 y.o.   MRN: [id number] Discharge Note-Seen by Burnett HarryShelly NP and she has [facility] and tomorrow has [place] bus ticket to go [place]. Seen by Child psychotherapistsocial worker and she has given him his bus ticket to [place].[facility] and he has arranged to pick it up today. [facility] tonight till the bus leaves tomorrow for [place] where he has family.Given a bus pass to get downtown.He understands the arrangement and it in agreement with it.He denies any thoughts to hurt self or others.[facility]erv[facility] AreolaEric [facility] gave him 3 bus tickets and $11.00 for the cab tomorrow from the bus station to the outpatient treatment center he has an appt with. All of his property returned to him and discharged.
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
## 2014-09-06 ENCOUNTER — Emergency Department (HOSPITAL_COMMUNITY)
Admission: EM | Admit: 2014-09-06 | Discharge: 2014-09-06 | Disposition: A | Discharge status: Home / Self Care | Payer: Self-pay | Attending: Emergency Medicine | Admitting: Emergency Medicine

## 2014-09-06 ENCOUNTER — Encounter (HOSPITAL_COMMUNITY): Payer: Self-pay

## 2014-09-06 DIAGNOSIS — I10 Essential (primary) hypertension: Secondary | ICD-10-CM | POA: Insufficient documentation

## 2014-09-06 DIAGNOSIS — F10920 Alcohol use, unspecified with intoxication, uncomplicated: Secondary | ICD-10-CM

## 2014-09-06 DIAGNOSIS — F10120 Alcohol abuse with intoxication, uncomplicated: Secondary | ICD-10-CM | POA: Insufficient documentation

## 2014-09-06 DIAGNOSIS — Z5900 Homelessness unspecified: Secondary | ICD-10-CM

## 2014-09-06 DIAGNOSIS — Z8719 Personal history of other diseases of the digestive system: Secondary | ICD-10-CM | POA: Insufficient documentation

## 2014-09-06 DIAGNOSIS — F319 Bipolar disorder, unspecified: Secondary | ICD-10-CM | POA: Insufficient documentation

## 2014-09-06 DIAGNOSIS — Z720 Tobacco use: Secondary | ICD-10-CM | POA: Insufficient documentation

## 2014-09-06 DIAGNOSIS — Z79899 Other long term (current) drug therapy: Secondary | ICD-10-CM | POA: Insufficient documentation

## 2014-09-06 LAB — RAPID URINE DRUG SCREEN, HOSP PERFORMED
Amphetamines: NOT DETECTED
Barbiturates: NOT DETECTED
Benzodiazepines: NOT DETECTED
COCAINE: NOT DETECTED
Opiates: NOT DETECTED
Tetrahydrocannabinol: POSITIVE — AB

## 2014-09-06 LAB — COMPREHENSIVE METABOLIC PANEL
ALT: 53 U/L (ref 0–53)
AST: 60 U/L — ABNORMAL HIGH (ref 0–37)
Albumin: 4.9 g/dL (ref 3.5–5.2)
Alkaline Phosphatase: 54 U/L (ref 39–117)
Anion gap: 6 (ref 5–15)
BUN: 13 mg/dL (ref 6–23)
CALCIUM: 9.1 mg/dL (ref 8.4–10.5)
CO2: 26 mmol/L (ref 19–32)
Chloride: 108 mEq/L (ref 96–112)
Creatinine, Ser: 1.02 mg/dL (ref 0.50–1.35)
GFR calc Af Amer: >90 mL/min (ref >90)
GFR calc non Af Amer: 86 mL/min — ABNORMAL LOW (ref >90)
Glucose, Bld: 93 mg/dL (ref 70–99)
Potassium: 4.4 mmol/L (ref 3.5–5.1)
SODIUM: 140 mmol/L (ref 135–145)
TOTAL PROTEIN: 8.2 g/dL (ref 6.0–8.3)
Total Bilirubin: 1.6 mg/dL — ABNORMAL HIGH (ref 0.3–1.2)

## 2014-09-06 LAB — CBC
HEMATOCRIT: 47.8 % (ref 39.0–52.0)
Hemoglobin: 15.2 g/dL (ref 13.0–17.0)
MCH: 28.7 pg (ref 26.0–34.0)
MCHC: 31.8 g/dL (ref 30.0–36.0)
MCV: 90.4 fL (ref 78.0–100.0)
Platelets: 315 10*3/uL (ref 150–400)
RBC: 5.29 MIL/uL (ref 4.22–5.81)
RDW: 13.2 % (ref 11.5–15.5)
WBC: 5.7 10*3/uL (ref 4.0–10.5)

## 2014-09-06 LAB — ACETAMINOPHEN LEVEL: Acetaminophen (Tylenol), Serum: <10 ug/mL — ABNORMAL LOW (ref 10–30)

## 2014-09-06 LAB — SALICYLATE LEVEL: Salicylate Lvl: <4 mg/dL (ref 2.8–20.0)

## 2014-09-06 LAB — ETHANOL: Alcohol, Ethyl (B): 189 mg/dL — ABNORMAL HIGH (ref 0–9)
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
## 2014-09-06 NOTE — Progress Notes (Signed)
CSW met with pt at bedside to discuss disposition. Pt plans to follow up with [facility] to obtain his belongings from when he was arrested at the bus depot. Patient states he is no longer able to use his bus ticket because its non refundable. Pt stated he would follow up with other community agencies to find assistance to get to [employer].   Noreene Larsson [phone]  ED CSW 09/06/2014 8:44 AM
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
## 2014-09-06 NOTE — Discharge Instructions (Signed)
RESOURCE GUIDE ° °Chronic Pain Problems: °Contact [facility]  [phone] °Patients need to be referred by their primary care doctor. ° °Insufficient Money for Medicine: °Contact [employer] Way:  call "211" or [facility] [phone]. ° °No Primary Care Doctor: °Call [facility]  [phone] - can help you locate a primary care doctor that  accepts your insurance, provides certain services, etc. °Physician Referral Service- [phone] ° °Agencies that provide inexpensive medical care: °[facility]  [phone] °[facility]  [phone] °[facility]  [phone] °Women's Clinic  [phone] °Planned Parenthood  [phone] °[facility]  [phone] ° °Medicaid-accepting [place] Providers: °[facility]- [address] ° [phone], Mon-Fri 9am-7pm, Sat 9am-1pm °[facility]- [address], [address] ° [phone] °[facility]- [address], [address] ° [phone] °[facility]- [address] [place] [address] ° [phone] °[facility]- [address], [phone] ° Only accepts [place] Access Medicaid patients after they have their name  applied to their card ° °Self Pay (no insurance) in [place]: °Sickle Cell Patients: Dr Eric Dean, [facility] ° [address], [phone] °[facility]- [address] ° [phone] °      -     [facility]- [address] °      -     [facility]- see information above (Speak to Pam H if you do not have insurance) °      -  [facility]- [address], [phone] °      -  [facility] [place]- [address],  [phone] °      -  [facility]- [address] [place] [address], [phone] °      -  Dr Osei-Bonsu-  [address], [place], [phone] °      -  [facility]- [address], [phone] °      -   [facility]- [address] [place] [address], [phone], also [address], [phone] °      -    [facility]- [address], [phone], 1st & 3rd Saturday   every month, 10am-1pm ° °1) Find a Doctor and Pay Out of Pocket °Although you won't have to find out who is covered by your insurance plan, it is a good idea to ask around and get recommendations. You will then need to call the office and see if the doctor you have chosen will accept you as a new patient and what types of options they offer for patients who are self-pay. Some doctors offer discounts or will set up payment plans for their patients who do not have insurance, but you will need to ask so you aren't surprised when you get to your appointment. ° °2) Contact Your Local Health Department °Not all health departments have doctors that can see patients for sick visits, but many do, so it is worth a call to see if yours does. If you don't know where your local health department is, you can check in your phone book. The CDC also has a tool to help you locate your state's health department, and many state websites also have listings of all of their local health departments. ° °3) Find a Walk-in Clinic °If   your illness is not likely to be very severe or complicated, you may want to try a walk in clinic. These are popping up all over the country in pharmacies, drugstores, and shopping centers. They're usually staffed by nurse practitioners or physician assistants that have been trained to treat common illnesses and complaints. They're usually fairly quick and inexpensive. However, if you have serious medical issues or chronic medical problems, these are probably not your best option  [facility] [place], [facility], [address], [place], phone [phone] or [phone].  Monday - Friday, call for an appointment. [place], [facility], [address], [place], phone [phone] or [phone].  Monday - Friday, call for an appointment.  Abuse/Neglect: [place] [phone] [place] [phone] (After Hours)  Emergency Shelter:  [facility] [phone]  Maternity Homes: Room at the [place] [phone] [place] [phone]  MRSA Hotline #:   847-732-0121  [facility]. [address]         [place] Phone:  [phone]                                  Phone:  [phone]                   Phone:  [phone]  [facility], [place]- [phone]       -     [facility] in [place], [address]                                  [place] [phone] or [phone] (After Hours)   [place]  Substance Abuse Resources: Alcohol and Drug Services  [phone] [place] [phone] [facility] [place] Chinita Pester [phone] Residential & Outpatient Substance Abuse Program  [phone]  Psychological Services: [place]  [phone] [place]  [place], [place]. [address], [place], [place]: [phone] or [phone], [email or web address]  Dental Assistance  If unable to pay or uninsured, contact:  [facility] or [facility]. to become qualified for the adult dental clinic.  Patients with Medicaid: [facility] [phone] [address], [place] [address], [phone]  If unable to pay, or uninsured, contact [facility] [phone]) or [place] [phone] in [place], [place] in [facility]) to become qualified for the adult dental clinic  Other Low-Cost  [employer] Services: [facility]- [address] [place], [place], [address], [place], [address], 2nd and 4th Thursday of the month at 6:30am.  10 clients each day by appointment, can sometimes see walk-in patients if someone does not show for an appointment. [facility]- [address] [place]ard Danker [place], [place], [address], [place], [place], [place], [address], [place] [facility] [phone]  Please make every effort to establish with a primary care physician for routine medical care  [place]  The [place] provides a wide range of adult health services. Some of these services are designed to address the healthcare needs of all [facility] residents and all services are designed to meet the needs of uninsured/underinsured low income residents. Some services are available to any resident of [place], call [phone] for details. ] [facility], a new medical clinic for adults, is now open. For more information about the Center and its services please call [phone]. For information on our [place] services, click here.  For more information on any of the following Department of Public Health programs, including hours of service, click on the highlighted link.  SERVICES FOR WOMEN (Adults and Teens) [employer] provide a full range of birth control options plus education and counseling. New patient visit and annual return visits include a complete  examination, pap test as indicated, and other laboratory as indicated. Included is our [employer] for men.  Maternity Care is provided through pregnancy, including a six week post partum exam. Women who meet eligibility criteria for the Medicaid for Pregnant Women program, receive care free. Other women are charged on a sliding scale according to income. Note: [facility] provides services to pregnant women who have a Medicaid card. Call [phone] for an appointment in [place] or [phone] for an appointment in [facility].  Primary Care for Medicaid [place] Access Women is available through the [place]. As primary care provider for the [place] program, women may designate the [facility] clinic as their primary care provider.  PLEASE CALL R5958090 FOR AN APPOINTMENT FOR THE ABOVE SERVICES IN EITHER [place] OR [place]. Information available in [place] and [place].   Childbirth Education Classes are open to the public and offered to help families prepare for the best possible childbirth experience as well as to promote lifelong health and wellness. Classes are offered throughout the year and meet on the same night once a week for five weeks. Medicaid covers the cost of the classes for the mother-to-be and her partner. For participants without Medicaid, the cost of the class series is $45.00 for the mother-to-be and her partner. Class size is limited and registration is required. For more information or to register call [phone]. Baby items donated by [employer] and the [employer] [place] are given away during each class series.  SERVICES FOR WOMEN AND MEN Sexually Transmitted Infection appointments, including HIV testing, are available daily (weekdays, except holidays). Call early as same-day appointments are limited. For an appointment in either [facility] or [place],  call [phone]. Services are confidential and free of charge.  Skin Testing for Tuberculosis Please call [phone]. Adult Immunizations are available, usually for a fee. Please call [phone] for details.  PLEASE CALL [phone] FOR AN APPOINTMENT FOR THE ABOVE  SERVICES IN EITHER [place] OR [place].   [facility] provides up to the minute recommended vaccines for your travel destination. We also provide essential health and political information to help insure a safe and pleasurable travel experience. This program is self-sustaining, however, fees are very competitive. We are a CERTIFIED YELLOW FEVER IMMUNIZATION approved clinic site. PLEASE CALL [phone] FOR AN APPOINTMENT IN EITHER [place] OR [place].   If you have questions about the services listed above, we want to answer them! Email [place] at: [email or web address] Home Visiting Services for elderly and the disabled are available to residents of [facility] who are in need of care that compares to the care offered by a nursing home, have needs that can be met by the program, and have [place] Medicaid. Other short term services are available to residents 18 years and older who are unable to meet requirements for eligibility to receive services from a certified home health agency, spend the majority of time at home, and need care for six months or less.  PLEASE CALL H548482 OR [phone] FOR MORE INFORMATION. Medication Assistance Program serves as a link between pharmaceutical companies and patients to provide low cost or free prescription medications. This servce is available for residents who meet certain income restrictions and have no insurance coverage.  PLEASE CALL [phone] ([place]) OR [phone] ([place]) FOR MORE INFORMATION.  Updated Feb. 21, 2013

## 2014-09-06 NOTE — ED Notes (Signed)
Pt has in belonging bag:  Black glasses w/out lens, black watch, brown button up shirt, tan pants (w/ 1 twenty dollar bill), green belt, brown socks, brown boots, brown hat, brown zipp-up hoodie sweater, brown winter jacket.
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
## 2015-05-16 ENCOUNTER — Encounter (HOSPITAL_COMMUNITY): Payer: Self-pay | Admitting: Emergency Medicine

## 2015-05-16 ENCOUNTER — Emergency Department (HOSPITAL_COMMUNITY)
Admission: EM | Admit: 2015-05-16 | Discharge: 2015-05-16 | Disposition: A | Discharge status: Home / Self Care | Payer: Medicaid Other | Attending: Emergency Medicine | Admitting: Emergency Medicine

## 2015-05-16 ENCOUNTER — Emergency Department (HOSPITAL_COMMUNITY)
Admission: EM | Admit: 2015-05-16 | Discharge: 2015-05-16 | Disposition: A | Discharge status: Other Acute Inpatient Hospital | Payer: Medicaid Other | Source: Home / Self Care | Attending: Emergency Medicine | Admitting: Emergency Medicine

## 2015-05-16 ENCOUNTER — Encounter (HOSPITAL_COMMUNITY): Payer: Self-pay | Admitting: Behavioral Health

## 2015-05-16 ENCOUNTER — Inpatient Hospital Stay (HOSPITAL_COMMUNITY)
Admission: AD | Admit: 2015-05-16 | Discharge: 2015-05-22 | DRG: 885 | Disposition: A | Discharge status: Home / Self Care | Payer: Medicaid Other | Source: Intra-hospital | Attending: Psychiatry | Admitting: Psychiatry

## 2015-05-16 ENCOUNTER — Emergency Department (HOSPITAL_COMMUNITY): Payer: Medicaid Other

## 2015-05-16 DIAGNOSIS — F319 Bipolar disorder, unspecified: Secondary | ICD-10-CM | POA: Insufficient documentation

## 2015-05-16 DIAGNOSIS — F10229 Alcohol dependence with intoxication, unspecified: Secondary | ICD-10-CM | POA: Diagnosis present

## 2015-05-16 DIAGNOSIS — Y905 Blood alcohol level of 100-119 mg/100 ml: Secondary | ICD-10-CM | POA: Diagnosis present

## 2015-05-16 DIAGNOSIS — Z79899 Other long term (current) drug therapy: Secondary | ICD-10-CM | POA: Diagnosis not present

## 2015-05-16 DIAGNOSIS — R45851 Suicidal ideations: Secondary | ICD-10-CM

## 2015-05-16 DIAGNOSIS — R079 Chest pain, unspecified: Secondary | ICD-10-CM | POA: Insufficient documentation

## 2015-05-16 DIAGNOSIS — F332 Major depressive disorder, recurrent severe without psychotic features: Principal | ICD-10-CM | POA: Diagnosis present

## 2015-05-16 DIAGNOSIS — G4700 Insomnia, unspecified: Secondary | ICD-10-CM | POA: Diagnosis present

## 2015-05-16 DIAGNOSIS — F1410 Cocaine abuse, uncomplicated: Secondary | ICD-10-CM | POA: Diagnosis present

## 2015-05-16 DIAGNOSIS — F419 Anxiety disorder, unspecified: Secondary | ICD-10-CM | POA: Diagnosis not present

## 2015-05-16 DIAGNOSIS — Z720 Tobacco use: Secondary | ICD-10-CM | POA: Insufficient documentation

## 2015-05-16 DIAGNOSIS — F17210 Nicotine dependence, cigarettes, uncomplicated: Secondary | ICD-10-CM | POA: Diagnosis present

## 2015-05-16 DIAGNOSIS — I10 Essential (primary) hypertension: Secondary | ICD-10-CM | POA: Insufficient documentation

## 2015-05-16 DIAGNOSIS — F1994 Other psychoactive substance use, unspecified with psychoactive substance-induced mood disorder: Secondary | ICD-10-CM | POA: Diagnosis present

## 2015-05-16 DIAGNOSIS — R45850 Homicidal ideations: Secondary | ICD-10-CM | POA: Diagnosis present

## 2015-05-16 LAB — I-STAT TROPONIN, ED: Troponin i, poc: 0 ng/mL (ref 0.00–0.08)

## 2015-05-16 LAB — CBC
HCT: 38.9 % — ABNORMAL LOW (ref 39.0–52.0)
HEMATOCRIT: 40.9 % (ref 39.0–52.0)
HEMOGLOBIN: 13 g/dL (ref 13.0–17.0)
HEMOGLOBIN: 13.6 g/dL (ref 13.0–17.0)
MCH: 29.5 pg (ref 26.0–34.0)
MCH: 30 pg (ref 26.0–34.0)
MCHC: 33.4 g/dL (ref 30.0–36.0)
MCHC: 33.3 g/dL (ref 30.0–36.0)
MCV: 88.4 fL (ref 78.0–100.0)
MCV: 90.1 fL (ref 78.0–100.0)
Platelets: 227 10*3/uL (ref 150–400)
Platelets: 245 10*3/uL (ref 150–400)
RBC: 4.4 MIL/uL (ref 4.22–5.81)
RBC: 4.54 MIL/uL (ref 4.22–5.81)
RDW: 14.9 % (ref 11.5–15.5)
RDW: 15.1 % (ref 11.5–15.5)
WBC: 4 10*3/uL (ref 4.0–10.5)
WBC: 4 10*3/uL (ref 4.0–10.5)

## 2015-05-16 LAB — COMPREHENSIVE METABOLIC PANEL
ALBUMIN: 4.2 g/dL (ref 3.5–5.0)
ALK PHOS: 34 U/L — AB (ref 38–126)
ALT: 24 U/L (ref 17–63)
ANION GAP: 11 (ref 5–15)
AST: 41 U/L (ref 15–41)
BUN: 12 mg/dL (ref 6–20)
CALCIUM: 8.9 mg/dL (ref 8.9–10.3)
CO2: 24 mmol/L (ref 22–32)
CREATININE: 1.02 mg/dL (ref 0.61–1.24)
Chloride: 109 mmol/L (ref 101–111)
GFR calc Af Amer: >60 mL/min (ref >60)
GFR calc non Af Amer: >60 mL/min (ref >60)
GLUCOSE: 88 mg/dL (ref 65–99)
Potassium: 3.8 mmol/L (ref 3.5–5.1)
Sodium: 144 mmol/L (ref 135–145)
TOTAL PROTEIN: 7.6 g/dL (ref 6.5–8.1)

## 2015-05-16 LAB — SALICYLATE LEVEL: Salicylate Lvl: <4 mg/dL (ref 2.8–30.0)

## 2015-05-16 LAB — BASIC METABOLIC PANEL
ANION GAP: 11 (ref 5–15)
BUN: 8 mg/dL (ref 6–20)
CO2: 22 mmol/L (ref 22–32)
Calcium: 8.6 mg/dL — ABNORMAL LOW (ref 8.9–10.3)
Chloride: 108 mmol/L (ref 101–111)
Creatinine, Ser: 0.95 mg/dL (ref 0.61–1.24)
GFR calc non Af Amer: >60 mL/min (ref >60)
Glucose, Bld: 97 mg/dL (ref 65–99)
Potassium: 3.3 mmol/L — ABNORMAL LOW (ref 3.5–5.1)
Sodium: 141 mmol/L (ref 135–145)

## 2015-05-16 LAB — ETHANOL
ALCOHOL ETHYL (B): 223 mg/dL — AB (ref <5)
Alcohol, Ethyl (B): 135 mg/dL — ABNORMAL HIGH (ref <5)

## 2015-05-16 LAB — ACETAMINOPHEN LEVEL: Acetaminophen (Tylenol), Serum: <10 ug/mL — ABNORMAL LOW (ref 10–30)

## 2015-05-16 MED ORDER — QUETIAPINE FUMARATE 200 MG PO TABS
200.0000 mg | ORAL_TABLET | Freq: Every day | ORAL | Status: DC
  Administered 2015-05-16: 200 mg via ORAL
  Filled 2015-05-16 (×2): qty 1

## 2015-05-16 MED ORDER — AMLODIPINE BESYLATE 5 MG PO TABS
5.0000 mg | ORAL_TABLET | Freq: Every day | ORAL | Status: DC
Start: 2015-05-16 — End: 2015-05-17
  Administered 2015-05-16 – 2015-05-17 (×2): 5 mg via ORAL
  Filled 2015-05-16 (×4): qty 1

## 2015-05-16 MED ORDER — TRAZODONE HCL 50 MG PO TABS
50.0000 mg | ORAL_TABLET | Freq: Every evening | ORAL | Status: DC | PRN
  Administered 2015-05-16: 50 mg via ORAL
  Filled 2015-05-16: qty 1

## 2015-05-16 MED ORDER — NICOTINE 21 MG/24HR TD PT24: 21.0000 mg | MEDICATED_PATCH | Freq: Every day | TRANSDERMAL | Status: DC

## 2015-05-16 NOTE — ED Notes (Signed)
Spoke to Clydie Braun in the main lab, patient's ETOH sample will be sent after run in minilab.

## 2015-05-16 NOTE — ED Notes (Signed)
Bed: WBH35 Expected date:  Expected time:  Means of arrival:  Comments: Tr 3 

## 2015-05-16 NOTE — ED Notes (Signed)
Dr. Nanavanti at the bedside.  

## 2015-05-16 NOTE — ED Notes (Signed)
MD at bedside. 
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
## 2015-05-16 NOTE — Progress Notes (Signed)
Admission note: Pt presented irritable on approach. Pt was refusing to answer admission questions. During admission process, pt forwarded little information. Pt reported that he's here at [facility] for SI and substances abuse. Pt stated he was drinking 5-6 40 oz a day. Pt denies suicidal thoughts at this time. Pt belongings placed in locker. Alcohol screening sheet given to pt and explained. Pt safely placed in Obs.

## 2015-05-16 NOTE — BH Assessment (Signed)
Consulted with Dr. Jannifer Franklin who recommends patient be observed overnight and evaluated by psychiatry in the morning.   Davina Poke, LCSW Therapeutic Triage Specialist [facility] 05/16/2015 10:03 AM

## 2015-05-16 NOTE — BH Assessment (Signed)
[facility] Assessment Progress Note  Per Thedore Mins, MD, this pt would benefit from admission to the [facility] Observation Unit at this time.  Berneice Heinrich, RN, [facility] has assigned pt to Obs 6.  Pt has signed Voluntary Admission and Consent for Treatment, as well as Consent to Release Information to no one, and signed forms have been faxed to [facility].  Pt's nurse, Kendal Hymen, has been notified, and agrees to send original paperwork along with pt via Juel Burrow, and to call report to [phone].  Doylene Canning, MA Triage Specialist [phone]

## 2015-05-16 NOTE — Tx Team (Signed)
Initial Interdisciplinary Treatment Plan   PATIENT STRESSORS: Medication change or noncompliance Substance abuse   PATIENT STRENGTHS: Ability for insight Capable of independent living   PROBLEM LIST: Problem List/Patient Goals Date to be addressed Date deferred Reason deferred Estimated date of resolution  "substance abuse" 05/16/15     "SI" 05/16/15                                                DISCHARGE CRITERIA:  Ability to meet basic life and health needs Adequate post-discharge living arrangements Improved stabilization in mood, thinking, and/or behavior Verbal commitment to aftercare and medication compliance  PRELIMINARY DISCHARGE PLAN: Attend aftercare/continuing care group Attend PHP/IOP  PATIENT/FAMIILY INVOLVEMENT: This treatment plan has been presented to and reviewed with the patient, Kirtis Challis, and/or family member.  The patient and family have been given the opportunity to ask questions and make suggestions.  Veanna Dower L 05/16/2015, 3:03 PM

## 2015-05-16 NOTE — ED Notes (Signed)
Blood samples sent to main lab. Dark green tube given to [facility] in minilab for istat troponin, then to be sent to main lab for ETOH. She acknowledges.

## 2015-05-16 NOTE — Discharge Instructions (Signed)

## 2015-05-16 NOTE — ED Notes (Addendum)
Patient arrives with [employer], states he needs to get back on his medications. Patient states he is supposed to take Seroquel and Depakote. Patient states he just arrived in the area from [place]. Patient is requesting to be referred to [facility]. Patient states he does "a little powder" clarifies this to be cocaine, and drinks ETOH 4 of 7 days, states he drinks 5-6 40oz beers, denies drinking liquor. Patient denies SI but states he wants to hurt someone else, is unable to state who or how he wants to hurt them. Patient states he has hx of HTN but does not take medications for that either.

## 2015-05-16 NOTE — ED Notes (Signed)
Patient swearing at staff. Security and [employer] at the bedside. Explained discharge instructions, patient not receptive to teaching.

## 2015-05-16 NOTE — ED Notes (Signed)
Per ems, [place] in front of [employer] for chest pain. Patient became hostile and uncooperative with ems, arrived to ed with [employer] escort.  Patient received of aspirin, left forearm has 16g iv. Heart rate 108, palpated bp 122 with ems. etoh consumption.
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
## 2015-05-16 NOTE — BHH Counselor (Signed)
Spoke with patient regarding plans [patient did not want to discuss situation at this time] and situation. Patient mentioned that he would like to wait on seeing the provider /extender, and agreed to discuss treatment/ discharge plans with this writer afterwards. As of yet patient stated that he has been located in [place] as of 1 day ago this date, and that he has nowhere to reside upon discharge and no plans as of yet.Odette Watanabe K. Telma Pyeatt, MS, NCC, LCAS-A  Counselor 05/16/2015 8:18 PM

## 2015-05-16 NOTE — ED Notes (Signed)
Pt oriented to room and unit.  He is irritable and did not want to answer questions.  He said " Im tired of talking I just had to answer a bunch of questions from the other lady."  I asked if I could ask a few yes or no questions and he agreed.  He denied AVH but said yes to HI and SI.  He denied pain.  15 minute checks in place and continuous video monitoring.

## 2015-05-17 ENCOUNTER — Encounter (HOSPITAL_COMMUNITY): Payer: Self-pay | Admitting: Nurse Practitioner

## 2015-05-17 DIAGNOSIS — I10 Essential (primary) hypertension: Secondary | ICD-10-CM | POA: Diagnosis present

## 2015-05-17 DIAGNOSIS — R45851 Suicidal ideations: Secondary | ICD-10-CM | POA: Diagnosis not present

## 2015-05-17 DIAGNOSIS — F1994 Other psychoactive substance use, unspecified with psychoactive substance-induced mood disorder: Secondary | ICD-10-CM | POA: Diagnosis not present

## 2015-05-17 DIAGNOSIS — R45850 Homicidal ideations: Secondary | ICD-10-CM | POA: Diagnosis not present

## 2015-05-17 DIAGNOSIS — F1410 Cocaine abuse, uncomplicated: Secondary | ICD-10-CM | POA: Diagnosis present

## 2015-05-17 DIAGNOSIS — F419 Anxiety disorder, unspecified: Secondary | ICD-10-CM | POA: Diagnosis present

## 2015-05-17 DIAGNOSIS — F17210 Nicotine dependence, cigarettes, uncomplicated: Secondary | ICD-10-CM | POA: Diagnosis present

## 2015-05-17 DIAGNOSIS — G4700 Insomnia, unspecified: Secondary | ICD-10-CM | POA: Diagnosis present

## 2015-05-17 DIAGNOSIS — Y905 Blood alcohol level of 100-119 mg/100 ml: Secondary | ICD-10-CM | POA: Diagnosis present

## 2015-05-17 DIAGNOSIS — F332 Major depressive disorder, recurrent severe without psychotic features: Secondary | ICD-10-CM | POA: Diagnosis present

## 2015-05-17 DIAGNOSIS — F10229 Alcohol dependence with intoxication, unspecified: Secondary | ICD-10-CM | POA: Diagnosis present

## 2015-05-17 MED ORDER — AMLODIPINE BESYLATE 5 MG PO TABS
5.0000 mg | ORAL_TABLET | Freq: Every day | ORAL | Status: DC
  Administered 2015-05-18 – 2015-05-22 (×5): 5 mg via ORAL
  Filled 2015-05-17 (×7): qty 1

## 2015-05-17 MED ORDER — ACETAMINOPHEN 325 MG PO TABS: 650.0000 mg | ORAL_TABLET | Freq: Four times a day (QID) | ORAL | Status: DC | PRN

## 2015-05-17 MED ORDER — QUETIAPINE FUMARATE 200 MG PO TABS
200.0000 mg | ORAL_TABLET | Freq: Every day | ORAL | Status: DC
  Administered 2015-05-17 – 2015-05-21 (×5): 200 mg via ORAL
  Filled 2015-05-17 (×7): qty 1

## 2015-05-17 MED ORDER — TRAZODONE HCL 50 MG PO TABS
50.0000 mg | ORAL_TABLET | Freq: Every evening | ORAL | Status: DC | PRN
  Administered 2015-05-17 – 2015-05-18 (×2): 50 mg via ORAL
  Filled 2015-05-17 (×2): qty 1

## 2015-05-17 MED ORDER — ALUM & MAG HYDROXIDE-SIMETH 200-200-20 MG/5ML PO SUSP: 30.0000 mL | ORAL | Status: DC | PRN

## 2015-05-17 MED ORDER — MAGNESIUM HYDROXIDE 400 MG/5ML PO SUSP: 30.0000 mL | Freq: Every day | ORAL | Status: DC | PRN

## 2015-05-17 NOTE — BHH Group Notes (Signed)
Adult Psychoeducational Group Note  Date:  05/17/2015 Time:  11:27 PM  Group Topic/Focus:  AA Meeting  Participation Level:  None  Participation Quality:  Attentive  Affect:  Flat  Cognitive:  Alert  Insight: None  Engagement in Group:  Limited  Modes of Intervention:  Discussion and Education  Additional Comments:  Patient attended group.  Caroll Rancher A 05/17/2015, 11:27 PM

## 2015-05-17 NOTE — Progress Notes (Signed)
D: Patient is alert and oriented. Patient mood and affect is flat, depressed, and irritable when awake. Patient denies SI, HI, AVH. Patient resting at this time. Patient has no concerns to voice at this time.   A: Emotional support given to patient at this time. q 15 min checks at this time.   R: Patient cooperative. Patient's safety and dignity maintained.

## 2015-05-17 NOTE — BHH Counselor (Signed)
Spoke with patient regarding plans and situation. Patient mentioned that he would like to wait on seeing the provider /extender, and agreed to discuss treatment/ discharge plans with this writer afterwards. As of yet patient stated that he has been located in [place] as of 1 day ago this date, and that he has nowhere to reside upon discharge and no plans as of yet.  Per Christen Bame, NP pt. will be seen in the a.m.   Shean K. Harris, MS, NCC, LCAS-A  Counselor 05/17/2015 12:48 AM

## 2015-05-17 NOTE — Progress Notes (Signed)
Nursing transfer note-  Patient in NAD during transfer process from Observation unit yo 306-2.  When ask what treatment he desires, patient responds "I don't know. It's all in the paper."  Denies SI/HI and has no physical complaints. Confirms previous inpatient admission and is unable to clarify why discharge planning was unsuccesful.  Oriented to unit and 15' checks initiated for safety.  Support offered.
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
## 2015-05-17 NOTE — Progress Notes (Signed)
D.  Pt pleasant on approach, denies complaints at this time.  Stated he was too tired for group, interacting appropriately with peers on the unit.  Denies SI/HI/hallucinations at this time.  A.  Support and encouragement offered  R.  Pt remains safe on unit, will continue to monitor.

## 2015-05-17 NOTE — Discharge Summary (Signed)
   Observation Discharge [place]  Vincent Gallegos  1966/04/04  [id number]    Patient was recommend for inpatient treatment for treatment of substance induced mood disorder.  Patient has been accepted to [facility].   Artyom Stencel B. Falana Clagg FNP-BC

## 2015-05-17 NOTE — Progress Notes (Signed)
Offered and attempt to complete [facility] MSE 3x and each time refused very rudely.  Hulan Fess, PMHNP-BC

## 2015-05-17 NOTE — Tx Team (Signed)
Initial Interdisciplinary Treatment Plan   PATIENT STRESSORS: Financial difficulties Medication change or noncompliance Substance abuse   PATIENT STRENGTHS: Wellsite geologist fund of knowledge Motivation for treatment/growth   PROBLEM LIST: Problem List/Patient Goals Date to be addressed Date deferred Reason deferred Estimated date of resolution  Suicidal ideation with no specific plan [id number]     Chronic substance abuse issues 05/17/2015     Ongoing psychsocial issues 05/17/2015                                          DISCHARGE CRITERIA:  Ability to meet basic life and health needs Improved stabilization in mood, thinking, and/or behavior Motivation to continue treatment in a less acute level of care Verbal commitment to aftercare and medication compliance  PRELIMINARY DISCHARGE PLAN: Attend 12-step recovery group Placement in alternative living arrangements Return to previous living arrangement  PATIENT/FAMIILY INVOLVEMENT: This treatment plan has been presented to and reviewed with the patient, Vincent Gallegos, and/or family member, The patient and family have been given the opportunity to ask questions and make suggestions.  [name] 05/17/2015, 5:29 PM
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
## 2015-05-17 NOTE — [facility] Counselor (Signed)
Tried again this a.m. for consult with Christen Bame, NP while patient was awake. Patient refused consult and was not cooperative after this 3rd attempt. Shean K. Harris, MS, NCC, LCAS-A  Counselor 05/17/2015 6:22 AM

## 2015-05-18 ENCOUNTER — Encounter (HOSPITAL_COMMUNITY): Payer: Self-pay | Admitting: Registered Nurse

## 2015-05-18 DIAGNOSIS — F332 Major depressive disorder, recurrent severe without psychotic features: Secondary | ICD-10-CM | POA: Insufficient documentation
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
## 2015-05-18 NOTE — Progress Notes (Signed)
D.  Pt pleasant on approach, no complaints voiced at this time.  Pt did not wish to attend evening AA group, stated too tired.  Pt is interacting appropriately with peers on the unit, and denies SI/HI/hallucinations at this time.  A.  Support and encouragement offered  R.  Pt remains safe on the unit, will continue to monitor.

## 2015-05-18 NOTE — Progress Notes (Signed)
Patient did not attend the evening speaker AA meeting. Pt was notified that group was beginning and returned to his room.    

## 2015-05-18 NOTE — [facility] Group Notes (Signed)
[facility] Group Notes:  (Nursing/MHT/Case Management/Adjunct)  Date:  05/18/2015  Time:0900  Type of Therapy:  Nurse Education  Participation Level:  Did Not Attend   Summary of Progress/Problems:  Dara Hoyer 05/18/2015, 9:54 AM

## 2015-05-18 NOTE — Progress Notes (Signed)
D:Pt irritable on approach. Pt reports he does not feel good and "just wants to rest." Patient denies physical pain. Patient denies SI/HI, Denies AVH. Forwards little on approach, limited during conversation. Pt not attending nursing group on the unit. Pt did get OOB to go to dining room for lunch. Pt refused to get OOB for medication, medication was brought to patient to his room.  A:Special checks q 15 mins for safety. Medication administered per MD order(see eMAR). Encouragement and support provided. Pt encouraged to be active on the milieu.  R:Safety maintained. Compliant with medication regimen. Will continue to monitor.

## 2015-05-18 NOTE — BHH Suicide Risk Assessment (Signed)
[facility] Admission Suicide Risk Assessment   Nursing information obtained from:  Patient Demographic factors:  Male, Low socioeconomic status, Living alone, Unemployed Current Mental Status:  Suicidal ideation indicated by patient, Self-harm thoughts Loss Factors:  Financial problems / change in socioeconomic status Historical Factors:  NA Risk Reduction Factors:  Positive therapeutic relationship Total Time spent with patient: 1 hour Principal Problem: Substance induced mood disorder Diagnosis:   Patient Active Problem List   Diagnosis Date Noted  . Depression [F32.9] 08/29/2014  . Substance induced mood disorder [F19.94] 08/27/2014  . Severe recurrent major depressive disorder with psychotic features [F33.3] 08/24/2014  . suicidal ideation [R45.850] 08/21/2014  . Mood disorder of depressed type [F32.9] 08/21/2014  . Polysubstance abuse [F19.10] 08/21/2014  . Suicidal ideation [R45.851]      Continued Clinical Symptoms:  Alcohol Use Disorder Identification Test Final Score (AUDIT): 21 The "Alcohol Use Disorders Identification Test", Guidelines for Use in Primary Care, Second Edition.  World Science writer [facility]). Score between 0-7:  no or low risk or alcohol related problems. Score between 8-15:  moderate risk of alcohol related problems. Score between 16-19:  high risk of alcohol related problems. Score 20 or above:  warrants further diagnostic evaluation for alcohol dependence and treatment.   CLINICAL FACTORS:   Depression:   Anhedonia Hopelessness Impulsivity Insomnia Dysthymia Alcohol/Substance Abuse/Dependencies More than one psychiatric diagnosis Unstable or Poor Therapeutic Relationship Previous Psychiatric Diagnoses and Treatments   Musculoskeletal: Strength & Muscle Tone: within normal limits Gait & Station: normal Patient leans: N/A  Psychiatric Specialty Exam: Physical Exam  Review of Systems  Constitutional: Negative.   Skin: Negative for rash.   Neurological: Negative for headaches.  Psychiatric/Behavioral: Positive for depression, suicidal ideas and substance abuse. The patient is nervous/anxious.     Blood pressure 143/100, pulse 80, temperature 97.7 F (36.5 C), temperature source Oral, resp. rate 18, height  (1.753 m), weight 77.111 kg (170 lb), SpO2 100 %.Body mass index is 25.09 kg/(m^2).  General Appearance: Casual  Eye Contact::  Fair  Speech:  Slow  Volume:  Decreased  Mood:  Dysphoric  Affect:  Constricted  Thought Process:  Coherent  Orientation:  Full (Time, Place, and Person)  Thought Content:  Rumination  Suicidal Thoughts:  Yes.  without intent/plan  Homicidal Thoughts:  No  Memory:  Immediate;   Fair Recent;   Fair  Judgement:  Poor  Insight:  Shallow  Psychomotor Activity:  Normal  Concentration:  Fair  Recall:  [employer] of Knowledge:Fair  Language: Fair  Akathisia:  Negative  Handed:  Right  AIMS (if indicated):     Assets:  Others:  poor support and coping skills  Sleep:     Cognition: WNL  ADL's:  Intact     COGNITIVE FEATURES THAT CONTRIBUTE TO RISK:  Closed-mindedness and Polarized thinking    SUICIDE RISK:   Moderate:  Frequent suicidal ideation with limited intensity, and duration, some specificity in terms of plans, no associated intent, good self-control, limited dysphoria/symptomatology, some risk factors present, and identifiable protective factors, including available and accessible social support.  PLAN OF CARE: Admit for safety and stabilization. Medication management and therapy.   Medical Decision Making:  Review of Psycho-Social Stressors (1), Order AIMS Test (2), Review of Last Therapy Session (1) and Review of Medication Regimen & Side Effects (2)  I certify that inpatient services furnished can reasonably be expected to improve the patient's condition.   AKHTAR, NADEEM 05/18/2015, 9:54 AM

## 2015-05-18 NOTE — B[facility] Group Notes (Signed)
[facility] Group Notes: (Clinical Social Work)   05/18/2015      Type of Therapy:  Group Therapy   Participation Level:  Did Not Attend despite MHT prompting   Tisheena Maguire Grossman-Orr, LCSW 05/18/2015, 12:54 PM     

## 2015-05-19 MED ORDER — TRAZODONE HCL 100 MG PO TABS
100.0000 mg | ORAL_TABLET | Freq: Every evening | ORAL | Status: DC | PRN
  Administered 2015-05-20 – 2015-05-21 (×2): 100 mg via ORAL
  Filled 2015-05-19 (×3): qty 1

## 2015-05-19 NOTE — B[facility] Group Notes (Signed)
[facility] Group Notes: (Clinical Social Work)   05/19/2015      Type of Therapy:  Group Therapy   Participation Level:  Did Not Attend despite MHT prompting   Mareida Grossman-Orr, LCSW 05/19/2015, 10:56 AM     
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
## 2015-05-19 NOTE — Progress Notes (Signed)
Patient did attend the evening speaker AA meeting.  

## 2015-05-19 NOTE — Plan of Care (Signed)
Problem: Diagnosis: Increased Risk For Suicide Attempt Goal: LTG-Patient Will Report Improved Mood and Deny Suicidal LTG (by discharge) Patient will report improved mood and deny suicidal ideation.  Outcome: Progressing Pt has been visible within milieu laughing, interacting, playing cards with peers, and watching TV.  Pt has denied suicidal ideation

## 2015-05-19 NOTE — Plan of Care (Signed)
Problem: Ineffective individual coping Goal: STG: Patient will remain free from self harm Outcome: Progressing Pt has remained free from self harm     
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
## 2015-05-19 NOTE — B[facility] Group Notes (Signed)
[facility] Group Notes:  (Nursing/MHT/Case Management/Adjunct)  Date:  05/19/2015  Time:  11:00  Type of Therapy:  Nurse Education  Participation Level:  Did Not Attend   Summary of Progress/Problems:  Dara Hoyer 05/19/2015, 11:44 AM

## 2015-05-19 NOTE — BHH Suicide Risk Assessment (Signed)
[facility] INPATIENT:  Family/Significant Other Suicide Prevention Education  Suicide Prevention Education:  Patient Refusal for Family/Significant Other Suicide Prevention Education: The patient Vincent Gallegos has refused to provide written consent for family/significant other to be provided Family/Significant Other Suicide Prevention Education during admission and/or prior to discharge.  Physician notified.  Sarina Ser 05/19/2015, 12:31 PM

## 2015-05-19 NOTE — Progress Notes (Signed)
D. Pt presents laying in his assigned bed majority of the shift. Pt irritable on approach. Refused to get OOB to take morning medication regimen, undersign brought medication to him. Pt denies SI/HI. Denies AVH. Not engaged in treatment plan.  Not attending groups on the unit. Isolating self in room.   A:Special checks q 15 mins in place for safety. Medication administered per MD order. Pt encouraged to get OOB and engage on unit. Encourgment and support provided.  R:Safety maintained. Compliant with medication regimen with much encouragement. Will continue to monitor.

## 2015-05-20 DIAGNOSIS — F1994 Other psychoactive substance use, unspecified with psychoactive substance-induced mood disorder: Secondary | ICD-10-CM

## 2015-05-20 MED ORDER — CHLORDIAZEPOXIDE HCL 25 MG PO CAPS
25.0000 mg | ORAL_CAPSULE | Freq: Three times a day (TID) | ORAL | Status: DC
  Administered 2015-05-22: 25 mg via ORAL
  Filled 2015-05-20: qty 1

## 2015-05-20 MED ORDER — ADULT MULTIVITAMIN W/MINERALS CH
1.0000 | ORAL_TABLET | Freq: Every day | ORAL | Status: DC
  Administered 2015-05-20 – 2015-05-22 (×3): 1 via ORAL
  Filled 2015-05-20 (×6): qty 1

## 2015-05-20 MED ORDER — LOPERAMIDE HCL 2 MG PO CAPS: 2.0000 mg | ORAL_CAPSULE | ORAL | Status: DC | PRN

## 2015-05-20 MED ORDER — THIAMINE HCL 100 MG/ML IJ SOLN
100.0000 mg | Freq: Once | INTRAMUSCULAR | Status: AC
  Administered 2015-05-20: 100 mg via INTRAMUSCULAR
  Filled 2015-05-20: qty 2

## 2015-05-20 MED ORDER — PNEUMOCOCCAL VAC POLYVALENT 25 MCG/0.5ML IJ INJ
0.5000 mL | INJECTION | INTRAMUSCULAR | Status: DC
Start: 2015-05-21 — End: 2015-05-22

## 2015-05-20 MED ORDER — CHLORDIAZEPOXIDE HCL 25 MG PO CAPS
25.0000 mg | ORAL_CAPSULE | Freq: Four times a day (QID) | ORAL | Status: AC
  Administered 2015-05-20 – 2015-05-21 (×6): 25 mg via ORAL
  Filled 2015-05-20 (×6): qty 1

## 2015-05-20 MED ORDER — CHLORDIAZEPOXIDE HCL 25 MG PO CAPS: 25.0000 mg | ORAL_CAPSULE | ORAL | Status: DC

## 2015-05-20 MED ORDER — CHLORDIAZEPOXIDE HCL 25 MG PO CAPS: 25.0000 mg | ORAL_CAPSULE | Freq: Four times a day (QID) | ORAL | Status: DC | PRN

## 2015-05-20 MED ORDER — VITAMIN B-1 100 MG PO TABS
100.0000 mg | ORAL_TABLET | Freq: Every day | ORAL | Status: DC
  Administered 2015-05-21 – 2015-05-22 (×2): 100 mg via ORAL
  Filled 2015-05-20 (×4): qty 1

## 2015-05-20 MED ORDER — CHLORDIAZEPOXIDE HCL 25 MG PO CAPS: 25.0000 mg | ORAL_CAPSULE | Freq: Every day | ORAL | Status: DC

## 2015-05-20 MED ORDER — ONDANSETRON 4 MG PO TBDP: 4.0000 mg | ORAL_TABLET | Freq: Four times a day (QID) | ORAL | Status: DC | PRN

## 2015-05-20 MED ORDER — HYDROXYZINE HCL 25 MG PO TABS: 25.0000 mg | ORAL_TABLET | Freq: Four times a day (QID) | ORAL | Status: DC | PRN

## 2015-05-20 NOTE — Plan of Care (Signed)
Problem: Diagnosis: Increased Risk For Suicide Attempt Goal: STG-Patient Will Comply With Medication Regime Outcome: Progressing Pt compliant with medication regimen     

## 2015-05-20 NOTE — Progress Notes (Signed)
D: Patient presents laying in his assigned bed. Forwards little on approach. He denies SI/HI. Denies AVH. Denies physical problems. Not active on the unit. Pt reports he wants to rest.  A:Special checks q 15 mins in place for safety. Medication administered per MD order (see eMAR) Encouragement and support provided. Encouraged to engage on the unit.  R:Safety maintained. Compliant with medication regimen. Will continue to monitor.

## 2015-05-20 NOTE — Plan of Care (Signed)
Problem: Alteration in mood & ability to function due to Goal: STG-Patient will comply with prescribed medication regimen (Patient will comply with prescribed medication regimen)  Outcome: Progressing Pt. Compliant with prescribed medication regimen.

## 2015-05-20 NOTE — Plan of Care (Signed)
Problem: Alteration in mood & ability to function due to Goal: LTG-Pt reports reduction in suicidal thoughts (Patient reports reduction in suicidal thoughts and is able to verbalize a safety plan for whenever patient is feeling suicidal)  Outcome: Progressing Pt denies suicidal thoughts.     
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
## 2015-05-20 NOTE — Progress Notes (Signed)
Recreation Therapy Notes  Date: 09.19.2016 Time: 9:30am Location: [address] Group Room   Group Topic: Stress Management  Goal Area(s) Addresses:  Patient will actively participate in stress management techniques presented during session.   Behavioral Response: Did not attend.   Denise L Blanchfield, LRT/CTRS  Blanchfield, Denise L 05/20/2015 1:38 PM 

## 2015-05-20 NOTE — [facility] Group Notes (Signed)
[facility] LCSW Group Therapy 05/20/2015  1:15 PM   Type of Therapy: Group Therapy  Participation Level: Did Not Attend. Patient invited to participate but declined.   Kristin Drinkard, MSW, LCSWA Clinical Social Worker [facility] [phone]   

## 2015-05-20 NOTE — Clinical Social Work Note (Signed)
Referral faxed to [facility] at patient's request.   Kristin Drinkard, MSW, LCSWA Clinical Social Worker [facility] [phone]  

## 2015-05-20 NOTE — Progress Notes (Signed)
D: Pt has appropriate affect and pleasant mood.  He reports his day has been "all right" and that his goal is to "go to group."  Pt denies SI/HI, denies hallucinations, denies pain, denies withdrawal symptoms.  Pt has been visible in milieu interacting with peers and staff appropriately.  Pt attended evening group.   A: Introduced self to pt.  Met with pt 1:1 and provided support and encouragement.  Actively listened to pt.  Medications administered per order.  PRN medication administered for sleep. R: Pt is compliant with medications.  Pt verbally contracts for safety and reports he will notify staff of needs and concerns.  Will continue to monitor and assess.

## 2015-05-20 NOTE — [facility] Group Notes (Signed)
[facility] LCSW Aftercare Discharge Planning Group Note  05/20/2015  8:45 AM  Participation Quality: Did Not Attend. Patient invited to participate but declined.  Tyrie Porzio, MSW, LCSWA Clinical Social Worker [facility] [phone]   
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
## 2015-05-21 NOTE — [facility] Group Notes (Signed)
[facility] LCSW Group Therapy 05/21/2015  1:15 PM   Type of Therapy: Group Therapy  Participation Level: Did Not Attend. Patient invited to participate but declined.   Ander Wamser, MSW, LCSWA Clinical Social Worker [facility] [phone]   

## 2015-05-21 NOTE — Progress Notes (Signed)
Patient resting in bed this morning and upon awakening, angry and irritable. "You just woke me up. How do you think I am?" Reports having a sleep disorder and continues to experience poor sleep. Refused to complete self inventory. Later in day patient more pleasant and appreciative of care. Denies any withdrawal symptoms or pain. Patient has remained isolative to room. Denies SI/HI and remains safe. Vincent Gallegos

## 2015-05-21 NOTE — Plan of Care (Signed)
Problem: Ineffective individual coping Goal: STG: Patient will remain free from self harm Outcome: Progressing Patient has not engaged in self harm. Denies SI  Problem: Alteration in mood & ability to function due to Goal: STG-Patient will comply with prescribed medication regimen (Patient will comply with prescribed medication regimen)  Outcome: Progressing Patient med compliant.

## 2015-05-21 NOTE — Plan of Care (Signed)
Problem: Alteration in mood & ability to function due to Goal: STG-Patient will attend groups Outcome: Progressing Pt attended evening group on 05/20/15.

## 2015-05-21 NOTE — Tx Team (Addendum)
Interdisciplinary Treatment Plan Update (Adult) Date: 05/21/2015   Time Reviewed: 9:30 AM  Progress in Treatment: Attending groups: Minimally Participating in groups: Minimally Taking medication as prescribed: Yes Tolerating medication: Yes Family/Significant other contact made: No, patient has declined collateral contacts Patient understands diagnosis: Yes Discussing patient identified problems/goals with staff: Yes Medical problems stabilized or resolved: Yes Denies suicidal/homicidal ideation: Yes Issues/concerns per patient self-inventory: Yes Other:  New problem(s) identified: N/A  Discharge Plan or Barriers: Requesting referral to residential treatment at discharge.  9/20: [facility] referral pending.    Reason for Continuation of Hospitalization:  Depression Anxiety Medication Stabilization   Comments: N/A  Estimated length of stay: 2-3 days   Vincent Gallegos is a 49yo male who was hospitalized for SI and detox from alcohol, last here in 07/2014, did not go to follow-up after discharge or take medications. He has recently started receiving SSI and says he has an apartment in [place] but came to [place] for a court appearance, then became suicidal. He would like to get housing in [place] but does not get his check until 10/1, so says he needs someplace to go therefore would like to be referred to [facility]. He became agitated when informed that he may not get a bed there immediately upon discharge from [facility]. He agreed to be referred to [facility]. The patient would benefit from safety monitoring, medication evaluation, psychoeducation, group therapy, and discharge planning to link with ongoing resources. The patient declined referral to [facility] for smoking cessation. The Discharge Process and Patient Involvement form was reviewed with patient at the end of the Psychosocial Assessment, and the patient confirmed understanding and signed that document, which was placed in the paper chart.  Suicide Prevention Education was reviewed thoroughly, and a brochure left with patient. The patient refused consent for SPE to be provided to someone in his life.   Review of initial/current patient goals per problem list:  1. Goal(s): Patient will participate in aftercare plan   Met: Yes   Target date: 3-5 days post admission date   As evidenced by: Patient will participate within aftercare plan AEB aftercare Vincent Gallegos and housing plan at discharge being identified.  9/20: Goal not met: CSW assessing for appropriate referrals for pt and will have follow up secured prior to d/c.  9/21: Goal met: Patient plans to stay at shelter for the night before going to [facility] for continued treatment on 9/22.      2. Goal (s): Patient will exhibit decreased depressive symptoms and suicidal ideations.   Met: Adequate for discharge per MD   Target date: 3-5 days post admission date   As evidenced by: Patient will utilize self rating of depression at 3 or below and demonstrate decreased signs of depression or be deemed stable for discharge by MD.  9/20: Goal Progressing.  9/21: Adequate for discharge per MD: Patient reports feeling safe for discharge and denies SI.      4. Goal(s): Patient will demonstrate decreased signs of withdrawal due to substance abuse   Met: Yes   Target date: 3-5 days post admission date   As evidenced by: Patient will produce a CIWA/COWS score of 0, have stable vitals signs, and no symptoms of withdrawal  9/20: Goal met: No withdrawal symptoms reported this time per medical chart.      Attendees: Patient:    Family:    Physician:  Dr. Sabra Heck 05/21/2015 9:30 AM  Nursing: Corine Shelter , RN 05/21/2015 9:30 AM  Clinical Social Worker:  Erasmo Downer Drinkard,  [place] 05/21/2015 9:30 AM  Other: Peri Maris, LCSWA 05/21/2015 9:30 AM  Other: Lucinda Dell, [facility] Liaison 05/21/2015 9:30 AM  Other:    Other:      Scribe for Treatment Team:  Tilden Fossa, MSW, [employer] [phone]

## 2015-05-22 MED ORDER — TRAZODONE HCL 100 MG PO TABS: 100.0000 mg | ORAL_TABLET | Freq: Every evening | ORAL | Status: AC | PRN

## 2015-05-22 MED ORDER — AMLODIPINE BESYLATE 5 MG PO TABS: 5.0000 mg | ORAL_TABLET | Freq: Every day | ORAL | Status: AC

## 2015-05-22 MED ORDER — QUETIAPINE FUMARATE 200 MG PO TABS: 200.0000 mg | ORAL_TABLET | Freq: Every day | ORAL | Status: AC

## 2015-05-22 NOTE — Progress Notes (Signed)
D: Pt has anxious affect and mood.  Pt reports he was "trying to leave this evening, I'm planning to leave tomorrow."  Pt reports he had a phone interview with [facility] and it "didn't sound too good."  Pt was asked what he will do if he discharges tomorrow and pt states "I don't know, I'll figure something out."  When asked if he feels like he would be safe to discharge tomorrow, pt states "I don't know, I should be all right."  Pt denies SI/HI, denies hallucinations, denies pain.  Pt was more visible in milieu after group was over. Pt did not attend evening group.   A:  Met with pt 1:1 and provided support and encouragement.  Actively listened to pt.  Medications administered per order.  PO fluids encouraged and provided.  PRN medication administered for sleep. R: Pt is compliant with medications.  Pt verbally contracts for safety and reports that he will notify staff of needs and concerns.  Will continue to monitor and assess.
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
## 2015-05-22 NOTE — Progress Notes (Signed)
  [facility] Adult Case Management Discharge Plan :  Will you be returning to the same living situation after discharge:  Yes,  patient provided with bus pass At discharge, do you have transportation home?: Yes,  patient provided with bus pass Do you have the ability to pay for your medications: Yes,  patient provided with prescriptions at discharge  Release of information consent forms completed and in the chart;  Patient's signature needed at discharge.  Patient to Follow up at: Follow-up Information    Follow up with [facility] On 05/23/2015.   Why:  [facility] will pick you up at [facility] at 10:30am on Thursday 9/22. Please take your 2 week supply of meds and call Shayla at [facility] if you need to reschedule your appointment.   Contact information:   [address] [place] [phone]      Patient denies SI/HI: Yes,  denies    Safety Planning and Suicide Prevention discussed: Yes,  with patient   Have you used any form of tobacco in the last 30 days? (Cigarettes, Smokeless Tobacco, Cigars, and/or Pipes): Yes  Has patient been referred to the Quitline?: Patient refused referral  [place], [place] 05/22/2015, 4:07 PM

## 2015-05-22 NOTE — Progress Notes (Signed)
Adult Psychoeducational Group Note  Date: 05/21/2015 Time:  8:50 PM  Group Topic/Focus:  Wrap-Up Group:   The focus of this group is to help patients review their daily goal of treatment and discuss progress on daily workbooks.  Participation Level:  Did Not Attend  Participation Quality:  Not Applicable  Affect:  Not Applicable  Cognitive:  Not Applicable  Insight: None  Engagement in Group:  Not Applicable  Modes of Intervention:  Not Applicable  Additional Comments:  Pt did not attend wrap-up group because pt was asleep.   Sharrell Ku Parker 05/22/2015, 1:42 AM

## 2015-05-22 NOTE — Plan of Care (Signed)
Problem: Alteration in mood & ability to function due to Goal: STG-Patient will attend groups Outcome: Not Progressing Pt did not attend evening group on 05/21/15.

## 2015-05-22 NOTE — Progress Notes (Signed)
Patient ID: Vincent Gallegos, male   DOB: 10-Mar-1966, 49 y.o.   MRN: [id number] Pt was discharged to lobby with three bus passes, scripts, AVS, and belongings. Pt voiced understanding of follow-up appointment and scripts. Pt was unwilling to wait for script samples, so staff was going to call in scripts for the pt to be picked up before he goes to [facility]. Pt verbalized understanding. Pt was irate that discharge did not occur fast enough.

## 2015-05-22 NOTE — BHH Suicide Risk Assessment (Signed)
[facility] Discharge Suicide Risk Assessment   Demographic Factors:  Male  Total Time spent with patient: 30 minutes  Musculoskeletal: Strength & Muscle Tone: within normal limits Gait & Station: normal Patient leans: normal  Psychiatric Specialty Exam: Physical Exam  ROS  Blood pressure 132/105, pulse 73, temperature 97.6 F (36.4 C), temperature source Oral, resp. rate 12, height  (1.753 m), weight 77.111 kg (170 lb), SpO2 100 %.Body mass index is 25.09 kg/(m^2).  General Appearance: Fairly Groomed  Patent attorney::  Fair  Speech:  Clear and Coherent409  Volume:  Normal  Mood:  Euthymic  Affect:  Appropriate  Thought Process:  Coherent and Goal Directed  Orientation:  Full (Time, Place, and Person)  Thought Content:  plans as she moves on, relapse prevention plan  Suicidal Thoughts:  No  Homicidal Thoughts:  No  Memory:  Immediate;   Fair Recent;   Fair Remote;   Fair  Judgement:  Fair  Insight:  Present  Psychomotor Activity:  Normal  Concentration:  Fair  Recall:  [employer] of Knowledge:Fair  Language: Fair  Akathisia:  No  Handed:  Right  AIMS (if indicated):     Assets:  Desire for Improvement  Sleep:  Number of Hours: 4.5  Cognition: WNL  ADL's:  Intact   Have you used any form of tobacco in the last 30 days? (Cigarettes, Smokeless Tobacco, Cigars, and/or Pipes): Yes  Has this patient used any form of tobacco in the last 30 days? (Cigarettes, Smokeless Tobacco, Cigars, and/or Pipes) Yes, A prescription for an FDA-approved tobacco cessation medication was offered at discharge and the patient refused  Mental Status Per Nursing Assessment::   On Admission:  Suicidal ideation indicated by patient, Self-harm thoughts  Current Mental Status by Physician: IN full contact with reality. There are no active S/S of withdrawal. There are no active SI plans or intent. He is going to [employer]. He meanwhile will be getting his documentation together at the [facility]   Loss  Factors: NA  Historical Factors: NA  Risk Reduction Factors:   Sense of responsibility to family  Continued Clinical Symptoms:  Depression:   Comorbid alcohol abuse/dependence Alcohol/Substance Abuse/Dependencies  Cognitive Features That Contribute To Risk:  Closed-mindedness, Polarized thinking and Thought constriction (tunnel vision)    Suicide Risk:  Minimal: No identifiable suicidal ideation.  Patients presenting with no risk factors but with morbid ruminations; may be classified as minimal risk based on the severity of the depressive symptoms  Principal Problem: Substance induced mood disorder Discharge Diagnoses:  Patient Active Problem List   Diagnosis Date Noted  . Polysubstance abuse [F19.10] 08/21/2014    Priority: High  . Major depressive disorder, recurrent, severe without psychotic features [F33.2]   . Depression [F32.9] 08/29/2014  . Substance induced mood disorder [F19.94] 08/27/2014  . Severe recurrent major depressive disorder with psychotic features [F33.3] 08/24/2014  . suicidal ideation [R45.850] 08/21/2014  . Mood disorder of depressed type [F32.9] 08/21/2014  . Suicidal ideation [R45.851]     Follow-up Information    Follow up with [facility] On 05/23/2015.   Why:  [facility] will pick you up at [facility] at 10:30am on Thursday 9/22. Please take your 2 week supply of meds and call Shayla at [facility] if you need to reschedule your appointment.   Contact information:   [address] [place] [phone]      Plan Of Care/Follow-up recommendations:  Activity:  as tolerated Diet:  regular Follow up [facility] as  above Is patient on multiple antipsychotic therapies at discharge:  No   Has Patient had three or more failed trials of antipsychotic monotherapy by history:  No  Recommended Plan for Multiple Antipsychotic Therapies: NA    LUGO,IRVING A 05/22/2015, 10:45 AM

## 2015-05-22 NOTE — Clinical Social Work Note (Signed)
Per Drema Pry at [facility], patient may have bed available for Thursday 9/22.  Samuella Bruin, MSW, [employer] Clinical Social Worker [facility] [phone]

## 2017-05-01 IMAGING — CR DG CHEST 2V
2 series · 2 of 2 positions shown · non-contrast
Comparison: None.

CLINICAL DATA: Chest pain, altered.

EXAM:
CHEST  2 VIEW

[chest lat]
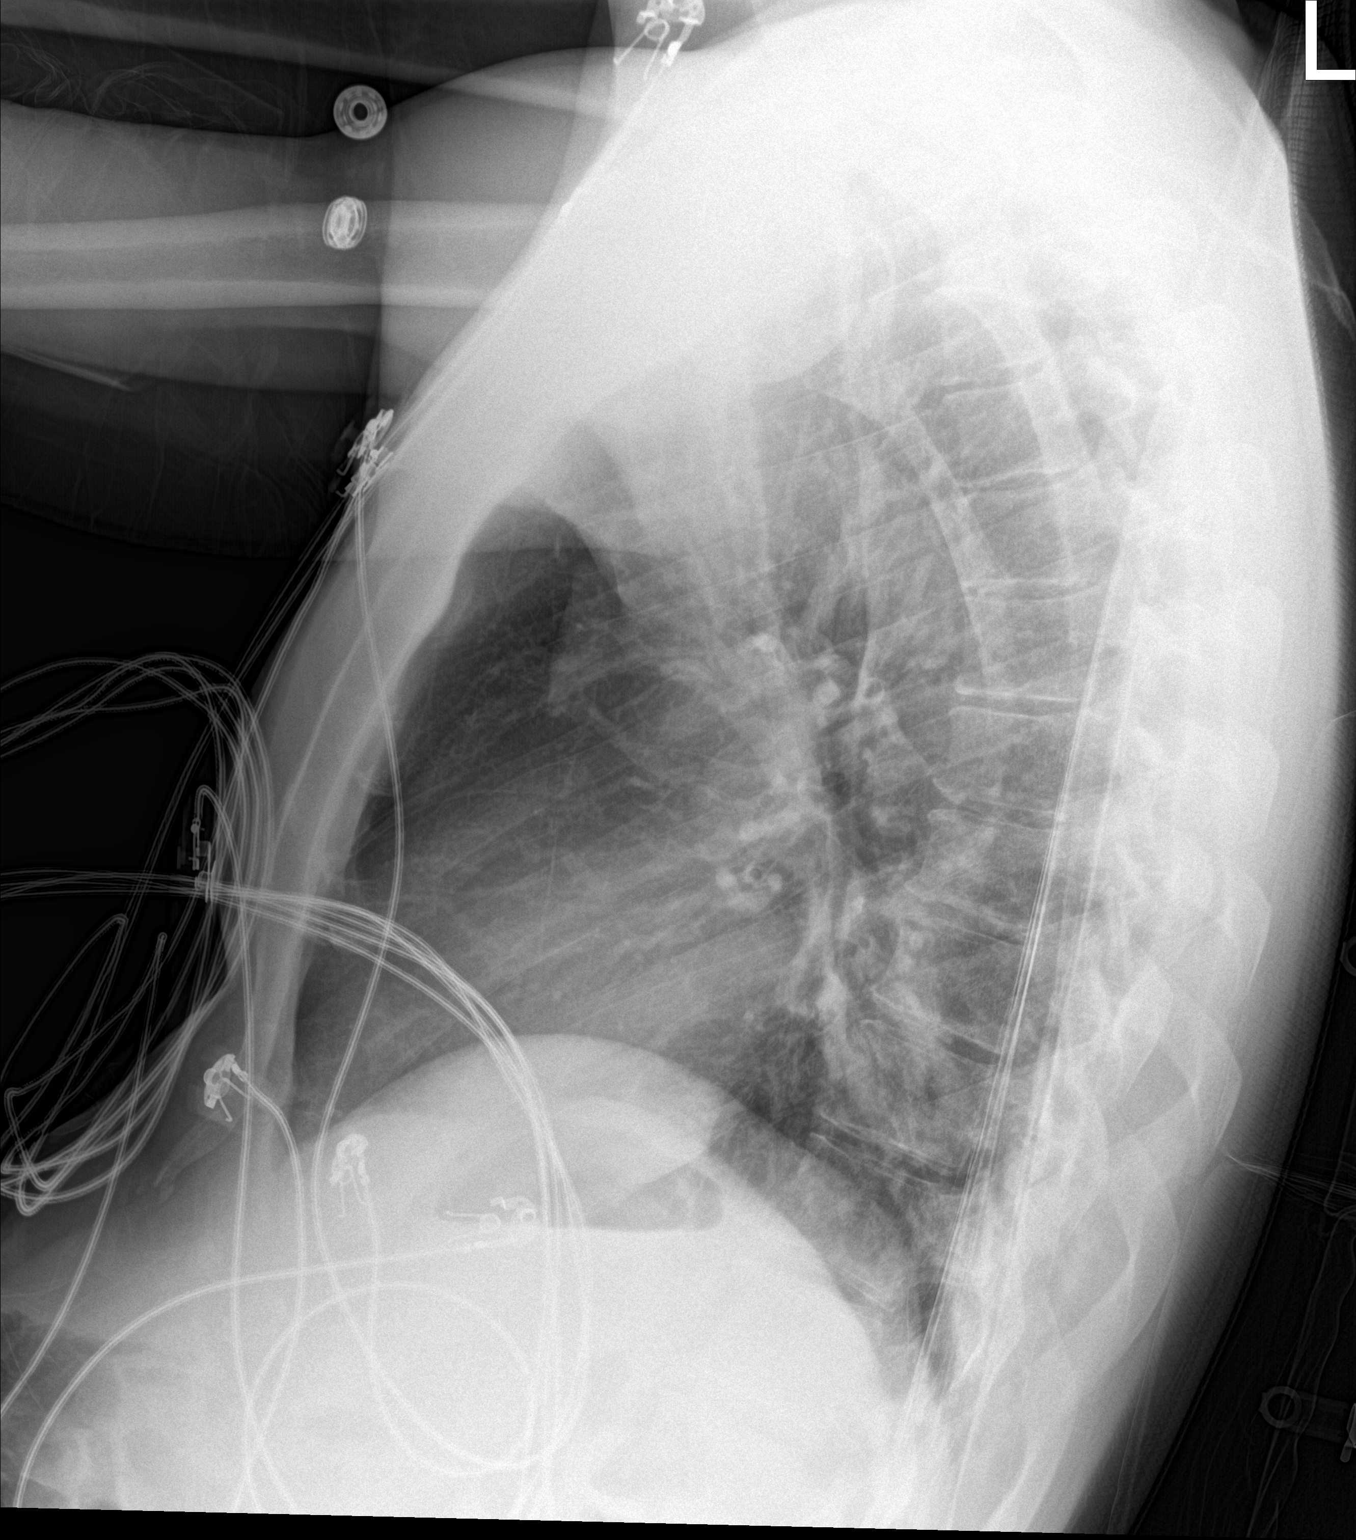

[chest ap]
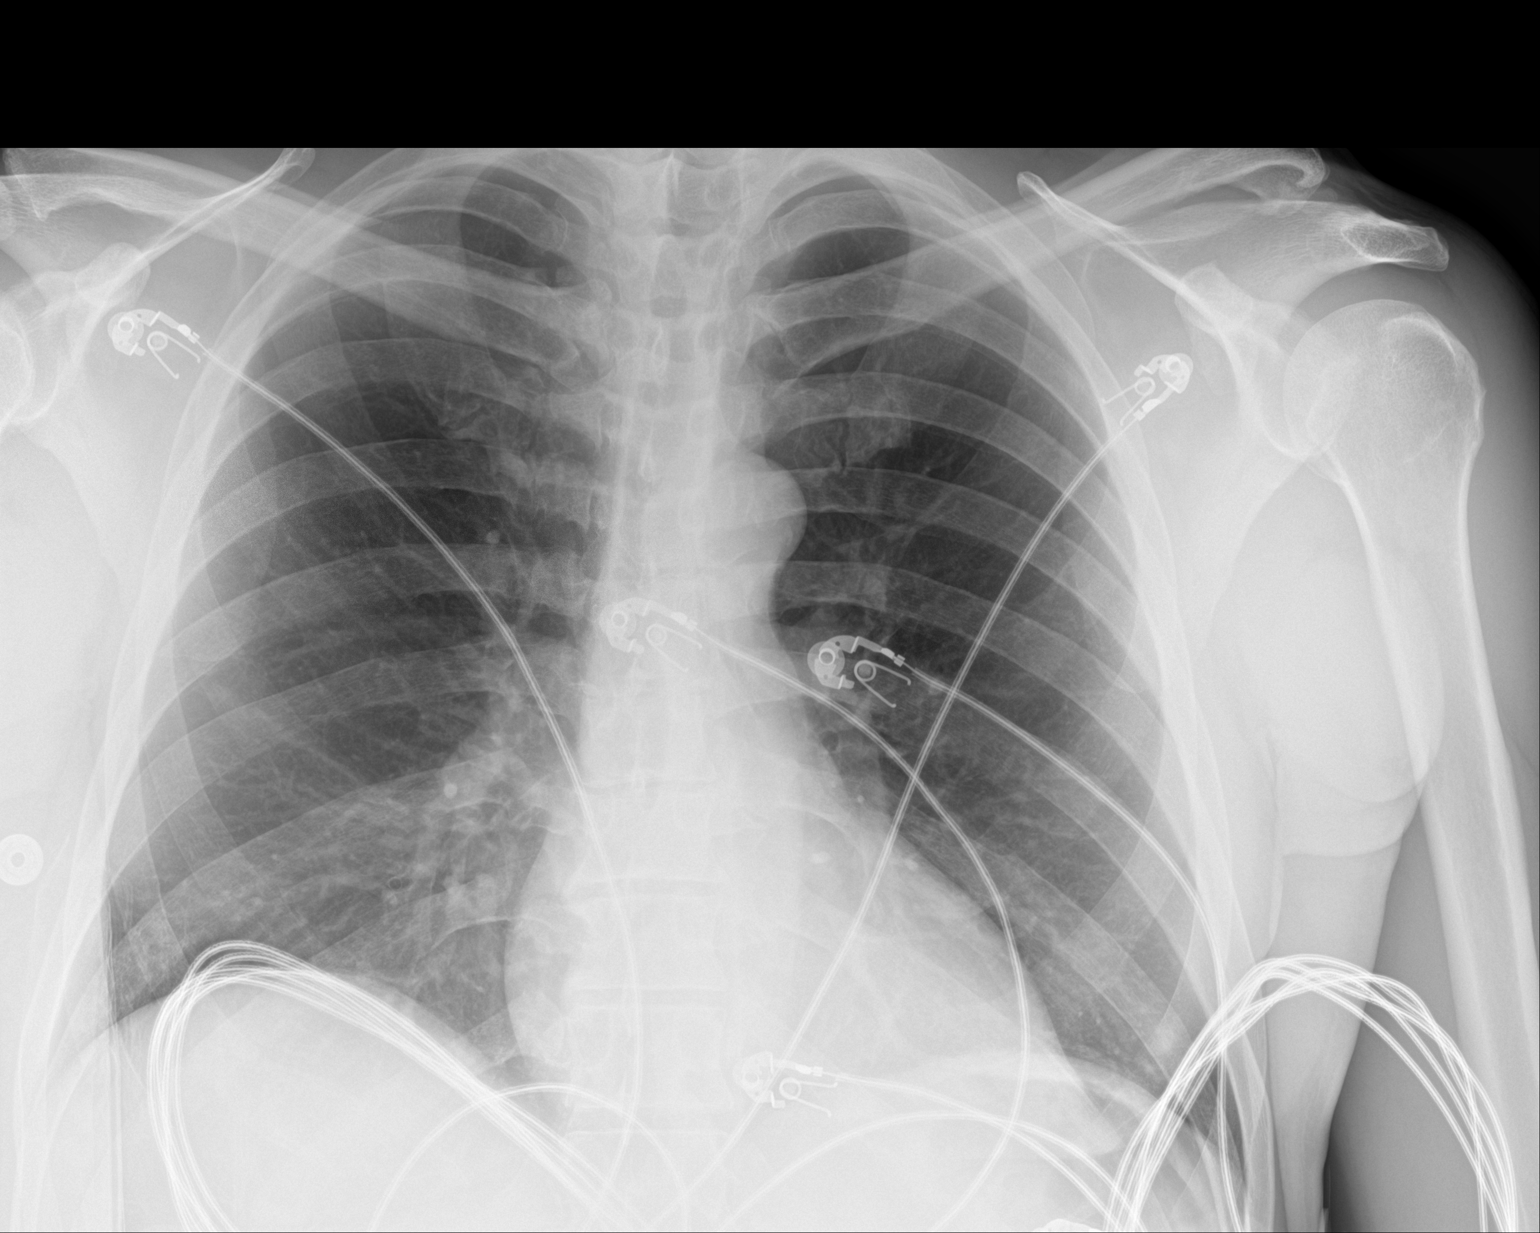

[2 of 2 positions shown; findings below may reference images not displayed]

FINDINGS: Cardiomediastinal silhouette is normal. The lungs are clear without
pleural effusions or focal consolidations. Trachea projects midline
and there is no pneumothorax. Soft tissue planes and included
osseous structures are non-suspicious. Mild degenerative change of
the thoracic spine. Old LEFT shoulder separation.
IMPRESSION: No acute cardiopulmonary process.

## 2021-05-01 DEATH — deceased

## 2021-05-31 DEATH — deceased
